# Patient Record
Sex: Female | Born: 1985 | Race: White | Hispanic: No | Marital: Married | Smoking: Never smoker
Health system: Southern US, Community
[De-identification: ages and names within clinical notes are randomized; demographics above are authoritative.]

## PROBLEM LIST (undated history)

## (undated) DIAGNOSIS — N3010 Interstitial cystitis (chronic) without hematuria: Secondary | ICD-10-CM

## (undated) DIAGNOSIS — M069 Rheumatoid arthritis, unspecified: Secondary | ICD-10-CM

## (undated) DIAGNOSIS — B009 Herpesviral infection, unspecified: Secondary | ICD-10-CM

## (undated) DIAGNOSIS — F419 Anxiety disorder, unspecified: Secondary | ICD-10-CM

## (undated) HISTORY — DX: Herpesviral infection, unspecified: B00.9

## (undated) HISTORY — PX: OTHER SURGICAL HISTORY: SHX169

## (undated) HISTORY — PX: CYSTOSCOPY: SUR368

---
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
## 2007-01-20 ENCOUNTER — Ambulatory Visit: Payer: Self-pay | Admitting: Family Medicine

## 2007-01-29 ENCOUNTER — Inpatient Hospital Stay (HOSPITAL_COMMUNITY): Admission: AD | Admit: 2007-01-29 | Discharge: 2007-01-29 | Payer: Self-pay | Admitting: Obstetrics & Gynecology

## 2007-02-18 ENCOUNTER — Ambulatory Visit: Payer: Self-pay | Admitting: Obstetrics & Gynecology

## 2007-02-25 ENCOUNTER — Ambulatory Visit (HOSPITAL_COMMUNITY): Admission: RE | Admit: 2007-02-25 | Discharge: 2007-02-25 | Payer: Self-pay | Admitting: Obstetrics & Gynecology

## 2007-03-11 ENCOUNTER — Ambulatory Visit: Payer: Self-pay | Admitting: Obstetrics & Gynecology

## 2007-03-18 ENCOUNTER — Ambulatory Visit: Payer: Self-pay | Admitting: Obstetrics & Gynecology

## 2007-04-09 ENCOUNTER — Ambulatory Visit: Payer: Self-pay | Admitting: Obstetrics & Gynecology

## 2007-04-20 ENCOUNTER — Ambulatory Visit: Payer: Self-pay | Admitting: Gynecology

## 2007-04-30 ENCOUNTER — Ambulatory Visit: Payer: Self-pay | Admitting: Obstetrics & Gynecology

## 2007-05-07 ENCOUNTER — Ambulatory Visit: Payer: Self-pay | Admitting: Obstetrics & Gynecology

## 2007-05-18 ENCOUNTER — Ambulatory Visit: Payer: Self-pay | Admitting: Gynecology

## 2007-05-25 ENCOUNTER — Ambulatory Visit: Payer: Self-pay | Admitting: Gynecology

## 2007-05-25 ENCOUNTER — Inpatient Hospital Stay (HOSPITAL_COMMUNITY): Admission: AD | Admit: 2007-05-25 | Discharge: 2007-05-26 | Payer: Self-pay | Admitting: Obstetrics & Gynecology

## 2007-05-28 ENCOUNTER — Ambulatory Visit: Payer: Self-pay | Admitting: Obstetrics & Gynecology

## 2007-05-29 ENCOUNTER — Ambulatory Visit (HOSPITAL_COMMUNITY): Admission: RE | Admit: 2007-05-29 | Discharge: 2007-05-29 | Payer: Self-pay | Admitting: Obstetrics & Gynecology

## 2007-06-02 ENCOUNTER — Inpatient Hospital Stay (HOSPITAL_COMMUNITY): Admission: RE | Admit: 2007-06-02 | Discharge: 2007-06-04 | Payer: Self-pay | Admitting: Gynecology

## 2007-06-02 ENCOUNTER — Ambulatory Visit: Payer: Self-pay | Admitting: Gynecology

## 2007-06-09 ENCOUNTER — Ambulatory Visit: Payer: Self-pay | Admitting: Obstetrics & Gynecology

## 2007-07-15 ENCOUNTER — Ambulatory Visit: Payer: Self-pay | Admitting: Obstetrics & Gynecology

## 2007-07-15 ENCOUNTER — Encounter (INDEPENDENT_AMBULATORY_CARE_PROVIDER_SITE_OTHER): Payer: Self-pay | Admitting: Gynecology

## 2008-04-30 ENCOUNTER — Emergency Department (HOSPITAL_COMMUNITY): Admission: EM | Admit: 2008-04-30 | Discharge: 2008-04-30 | Payer: Self-pay | Admitting: Emergency Medicine

## 2008-05-02 ENCOUNTER — Inpatient Hospital Stay (HOSPITAL_COMMUNITY): Admission: AD | Admit: 2008-05-02 | Discharge: 2008-05-02 | Payer: Self-pay | Admitting: Family Medicine

## 2008-05-06 ENCOUNTER — Inpatient Hospital Stay (HOSPITAL_COMMUNITY): Admission: AD | Admit: 2008-05-06 | Discharge: 2008-05-06 | Payer: Self-pay | Admitting: Family Medicine

## 2008-05-06 ENCOUNTER — Encounter: Payer: Self-pay | Admitting: Family Medicine

## 2008-06-08 ENCOUNTER — Encounter: Payer: Self-pay | Admitting: Obstetrics and Gynecology

## 2008-06-08 ENCOUNTER — Encounter: Payer: Self-pay | Admitting: Family Medicine

## 2008-06-08 ENCOUNTER — Ambulatory Visit: Payer: Self-pay | Admitting: Obstetrics and Gynecology

## 2008-06-08 ENCOUNTER — Other Ambulatory Visit: Admission: RE | Admit: 2008-06-08 | Discharge: 2008-06-08 | Payer: Self-pay | Admitting: Obstetrics and Gynecology

## 2008-07-22 ENCOUNTER — Ambulatory Visit (HOSPITAL_BASED_OUTPATIENT_CLINIC_OR_DEPARTMENT_OTHER): Admission: RE | Admit: 2008-07-22 | Discharge: 2008-07-22 | Payer: Self-pay | Admitting: Urology

## 2008-07-22 ENCOUNTER — Encounter (INDEPENDENT_AMBULATORY_CARE_PROVIDER_SITE_OTHER): Payer: Self-pay | Admitting: Urology

## 2009-02-20 ENCOUNTER — Ambulatory Visit: Payer: Self-pay | Admitting: Obstetrics & Gynecology

## 2009-02-20 ENCOUNTER — Encounter: Payer: Self-pay | Admitting: Family Medicine

## 2009-02-20 LAB — CONVERTED CEMR LAB
Basophils Relative: 0 % (ref 0–1)
HCT: 42.4 % (ref 36.0–46.0)
Lymphocytes Relative: 29 % (ref 12–46)
MCHC: 32.3 g/dL (ref 30.0–36.0)
MCV: 99.3 fL (ref 78.0–100.0)
Monocytes Relative: 8 % (ref 3–12)
Neutrophils Relative %: 61 % (ref 43–77)
RBC: 4.27 M/uL (ref 3.87–5.11)
RDW: 14 % (ref 11.5–15.5)
Rh Type: NEGATIVE

## 2009-02-22 ENCOUNTER — Ambulatory Visit (HOSPITAL_COMMUNITY): Admission: RE | Admit: 2009-02-22 | Discharge: 2009-02-22 | Payer: Self-pay | Admitting: Obstetrics & Gynecology

## 2009-03-01 ENCOUNTER — Encounter: Payer: Self-pay | Admitting: Family Medicine

## 2009-03-01 ENCOUNTER — Ambulatory Visit: Payer: Self-pay | Admitting: Family Medicine

## 2009-03-01 ENCOUNTER — Other Ambulatory Visit: Admission: RE | Admit: 2009-03-01 | Discharge: 2009-03-01 | Payer: Self-pay | Admitting: Family Medicine

## 2009-03-15 ENCOUNTER — Ambulatory Visit (HOSPITAL_COMMUNITY): Admission: RE | Admit: 2009-03-15 | Discharge: 2009-03-15 | Payer: Self-pay | Admitting: Obstetrics & Gynecology

## 2009-03-27 ENCOUNTER — Ambulatory Visit (HOSPITAL_COMMUNITY): Admission: RE | Admit: 2009-03-27 | Discharge: 2009-03-27 | Payer: Self-pay | Admitting: Obstetrics & Gynecology

## 2009-03-29 ENCOUNTER — Ambulatory Visit: Payer: Self-pay | Admitting: Obstetrics & Gynecology

## 2009-04-26 ENCOUNTER — Ambulatory Visit (HOSPITAL_COMMUNITY): Admission: RE | Admit: 2009-04-26 | Discharge: 2009-04-26 | Payer: Self-pay | Admitting: Obstetrics & Gynecology

## 2009-04-27 ENCOUNTER — Ambulatory Visit: Payer: Self-pay | Admitting: Obstetrics and Gynecology

## 2009-04-28 ENCOUNTER — Encounter: Payer: Self-pay | Admitting: Family Medicine

## 2009-05-15 ENCOUNTER — Ambulatory Visit (HOSPITAL_COMMUNITY): Admission: RE | Admit: 2009-05-15 | Discharge: 2009-05-15 | Payer: Self-pay | Admitting: Obstetrics & Gynecology

## 2009-05-22 ENCOUNTER — Ambulatory Visit: Payer: Self-pay | Admitting: Obstetrics and Gynecology

## 2009-05-23 ENCOUNTER — Encounter: Payer: Self-pay | Admitting: Obstetrics and Gynecology

## 2009-06-08 ENCOUNTER — Emergency Department (HOSPITAL_COMMUNITY): Admission: EM | Admit: 2009-06-08 | Discharge: 2009-06-08 | Payer: Self-pay | Admitting: Emergency Medicine

## 2009-06-12 ENCOUNTER — Ambulatory Visit: Payer: Self-pay | Admitting: Obstetrics and Gynecology

## 2009-06-19 ENCOUNTER — Ambulatory Visit: Payer: Self-pay | Admitting: Obstetrics and Gynecology

## 2009-07-13 ENCOUNTER — Ambulatory Visit: Payer: Self-pay | Admitting: Obstetrics and Gynecology

## 2009-07-13 ENCOUNTER — Encounter: Payer: Self-pay | Admitting: Obstetrics and Gynecology

## 2009-07-13 LAB — CONVERTED CEMR LAB
MCHC: 35.3 g/dL (ref 30.0–36.0)
MCV: 96.3 fL (ref 78.0–100.0)
RBC: 4 M/uL (ref 3.87–5.11)
RDW: 14.1 % (ref 11.5–15.5)
WBC: 10.1 10*3/uL (ref 4.0–10.5)

## 2009-07-21 ENCOUNTER — Encounter: Payer: Self-pay | Admitting: Obstetrics and Gynecology

## 2009-07-21 ENCOUNTER — Ambulatory Visit: Payer: Self-pay | Admitting: Obstetrics & Gynecology

## 2009-07-26 ENCOUNTER — Ambulatory Visit: Payer: Self-pay | Admitting: Family Medicine

## 2009-07-26 ENCOUNTER — Encounter: Payer: Self-pay | Admitting: Obstetrics and Gynecology

## 2009-08-07 ENCOUNTER — Ambulatory Visit: Payer: Self-pay | Admitting: Obstetrics and Gynecology

## 2009-08-24 ENCOUNTER — Ambulatory Visit: Payer: Self-pay | Admitting: Obstetrics & Gynecology

## 2009-09-06 ENCOUNTER — Ambulatory Visit: Payer: Self-pay | Admitting: Obstetrics & Gynecology

## 2009-09-14 ENCOUNTER — Ambulatory Visit: Payer: Self-pay | Admitting: Family Medicine

## 2009-09-15 ENCOUNTER — Encounter: Payer: Self-pay | Admitting: Family Medicine

## 2009-09-20 ENCOUNTER — Ambulatory Visit: Payer: Self-pay | Admitting: Family Medicine

## 2009-09-27 ENCOUNTER — Ambulatory Visit: Payer: Self-pay | Admitting: Obstetrics & Gynecology

## 2009-10-03 ENCOUNTER — Encounter: Payer: Self-pay | Admitting: Family Medicine

## 2009-10-03 ENCOUNTER — Ambulatory Visit: Payer: Self-pay | Admitting: Obstetrics & Gynecology

## 2009-10-03 LAB — CONVERTED CEMR LAB: Yeast Wet Prep HPF POC: NONE SEEN

## 2009-10-05 ENCOUNTER — Ambulatory Visit: Payer: Self-pay | Admitting: Advanced Practice Midwife

## 2009-10-05 ENCOUNTER — Inpatient Hospital Stay (HOSPITAL_COMMUNITY): Admission: AD | Admit: 2009-10-05 | Discharge: 2009-10-06 | Payer: Self-pay | Admitting: Family Medicine

## 2009-10-12 ENCOUNTER — Ambulatory Visit: Payer: Self-pay | Admitting: Family

## 2009-10-12 ENCOUNTER — Inpatient Hospital Stay (HOSPITAL_COMMUNITY): Admission: AD | Admit: 2009-10-12 | Discharge: 2009-10-15 | Payer: Self-pay | Admitting: Obstetrics & Gynecology

## 2009-12-06 ENCOUNTER — Ambulatory Visit: Payer: Self-pay | Admitting: Family Medicine

## 2010-01-10 ENCOUNTER — Emergency Department (HOSPITAL_COMMUNITY): Admission: EM | Admit: 2010-01-10 | Discharge: 2010-01-11 | Payer: Self-pay | Admitting: Emergency Medicine

## 2010-03-07 ENCOUNTER — Ambulatory Visit: Payer: Self-pay | Admitting: Obstetrics & Gynecology

## 2010-03-07 ENCOUNTER — Encounter (INDEPENDENT_AMBULATORY_CARE_PROVIDER_SITE_OTHER): Payer: Self-pay | Admitting: *Deleted

## 2010-03-07 LAB — CONVERTED CEMR LAB
Clue Cells Wet Prep HPF POC: NONE SEEN
Yeast Wet Prep HPF POC: NONE SEEN

## 2010-04-19 ENCOUNTER — Ambulatory Visit: Payer: Self-pay | Admitting: Gastroenterology

## 2010-04-19 DIAGNOSIS — K625 Hemorrhage of anus and rectum: Secondary | ICD-10-CM

## 2010-06-16 IMAGING — US OB
1 series · 14 of 16 positions shown · non-contrast
Comparison: Early OB ultrasound 05/02/2008.

CLINICAL DATA: Vaginal bleeding and cramping.  G2 P1.  LMP
03/08/2008 ([DATE] weeks 3 days).

TRANSVAGINAL OB ULTRASOUND 05/06/2008:
TECHNIQUE: Transvaginal ultrasound was performed for evaluation of
the gestation as well as the maternal uterus and adnexal regions.

[Series 1: ob · 0.18mm/px · 14 of 21 slices shown]
[im 1/21]
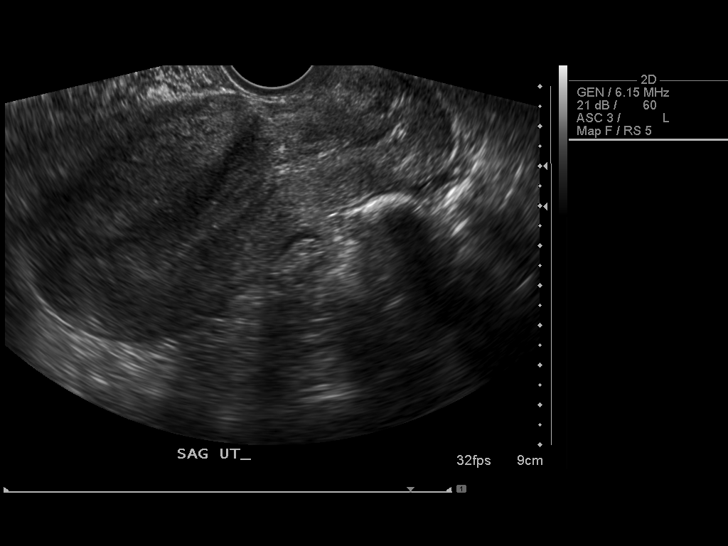
[im 2/21]
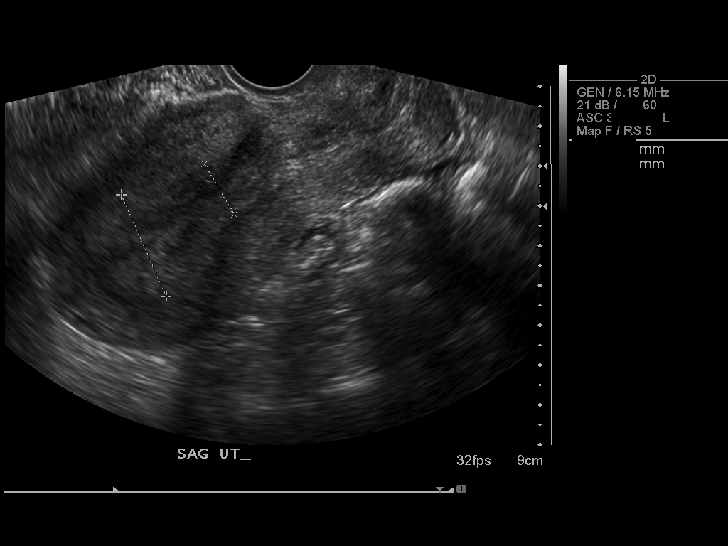
[im 3/21]
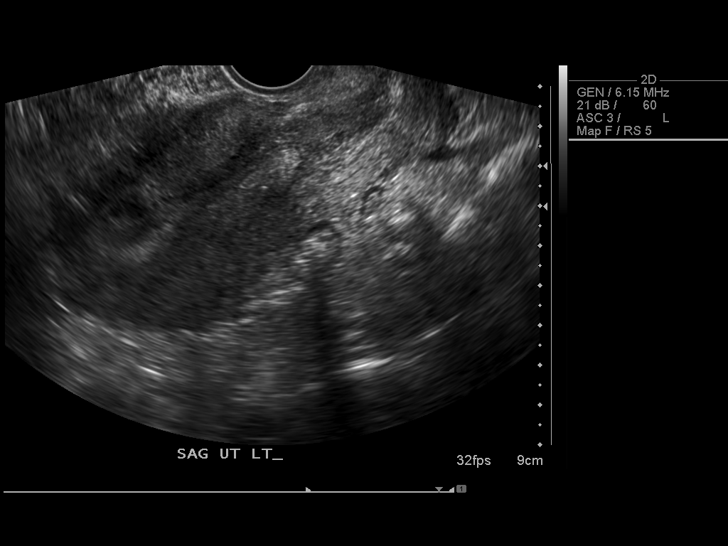
[im 6/21]
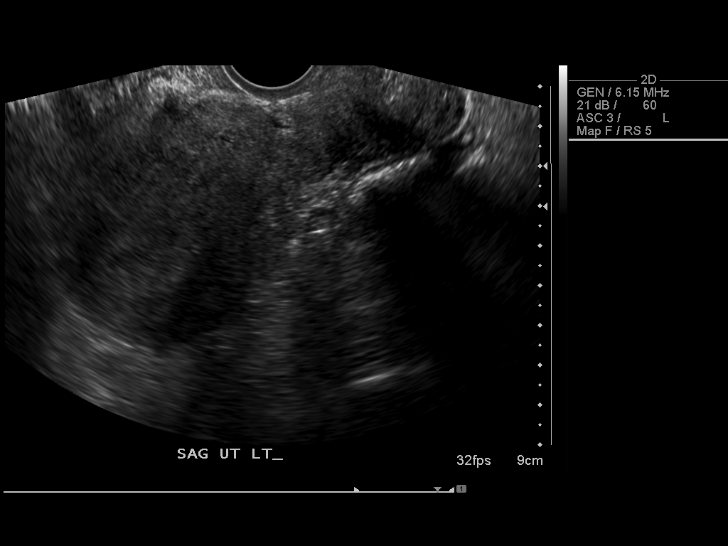
[im 7/21]
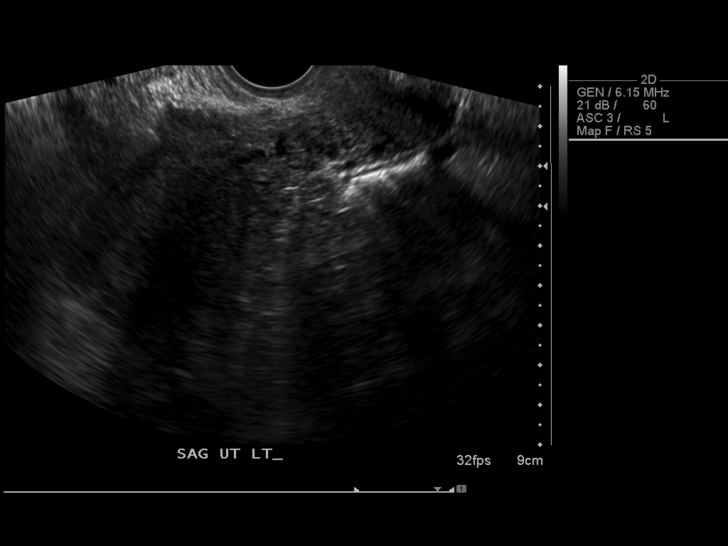
[im 9/21]
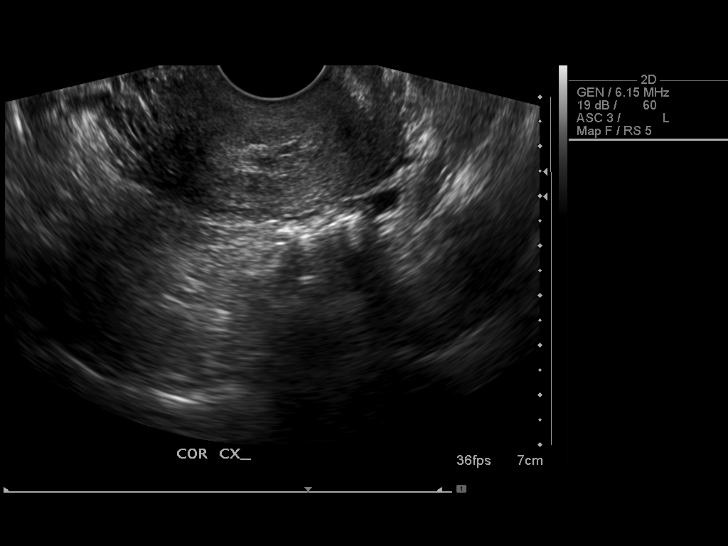
[im 10/21]
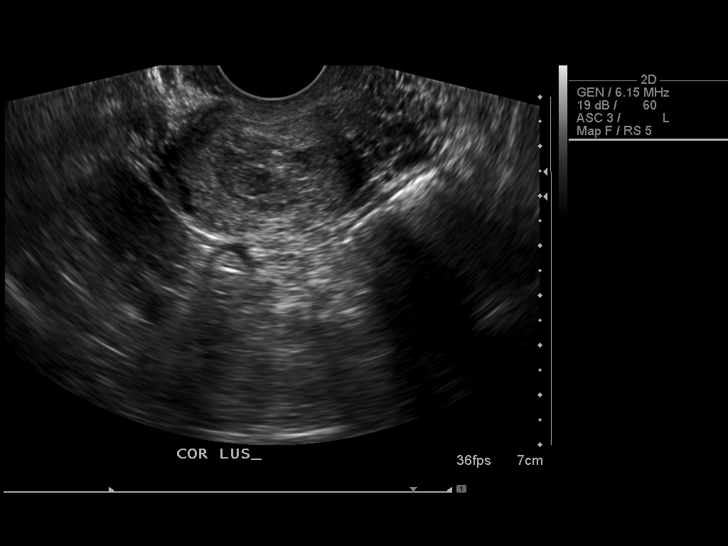
[im 11/21]
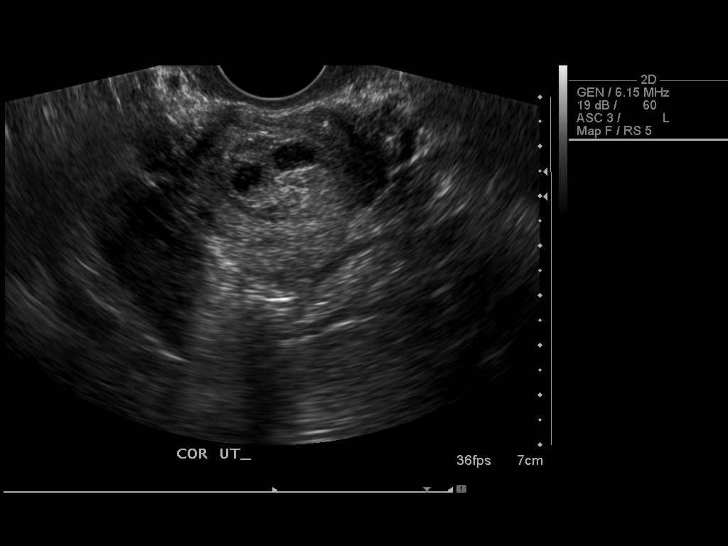
[im 13/21]
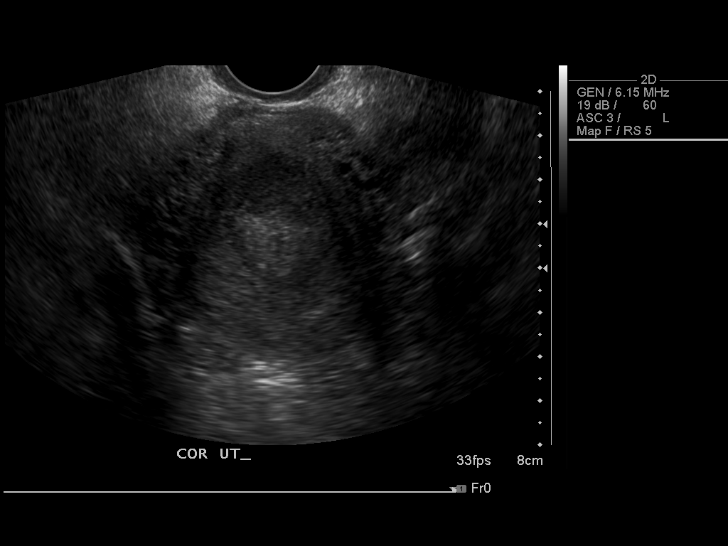
[im 14/21]
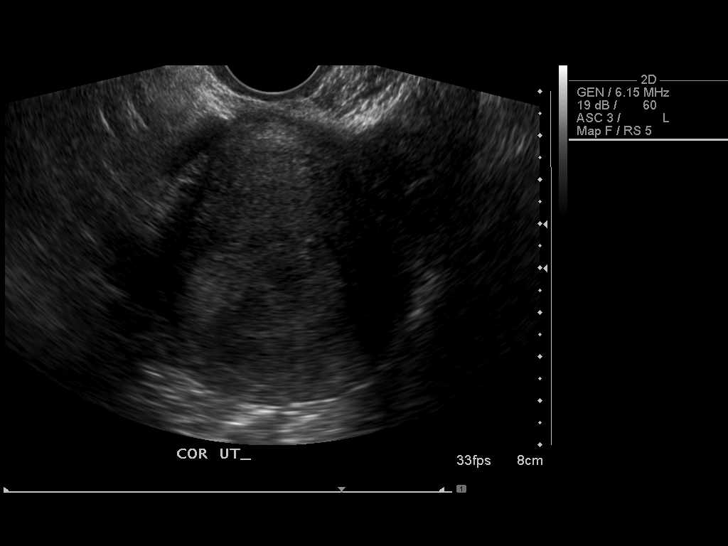
[im 17/21]
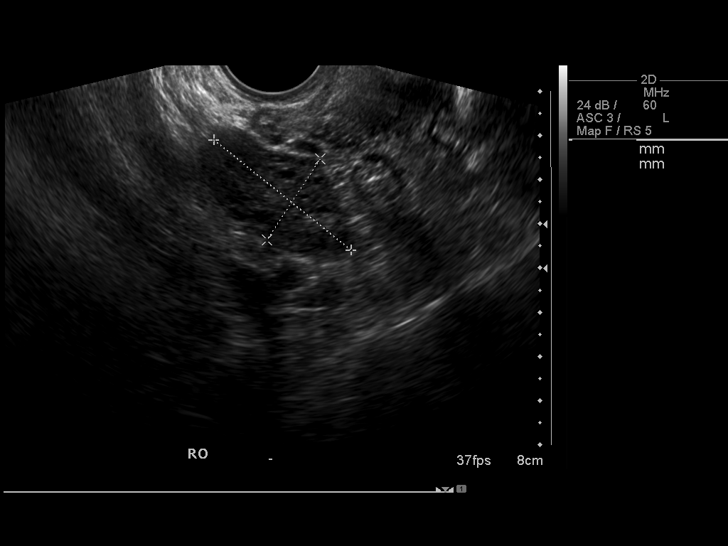
[im 18/21]
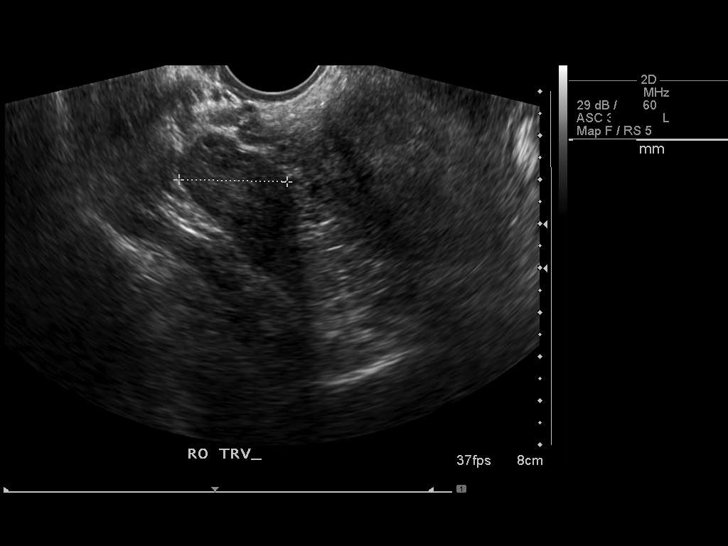
[im 19/21]
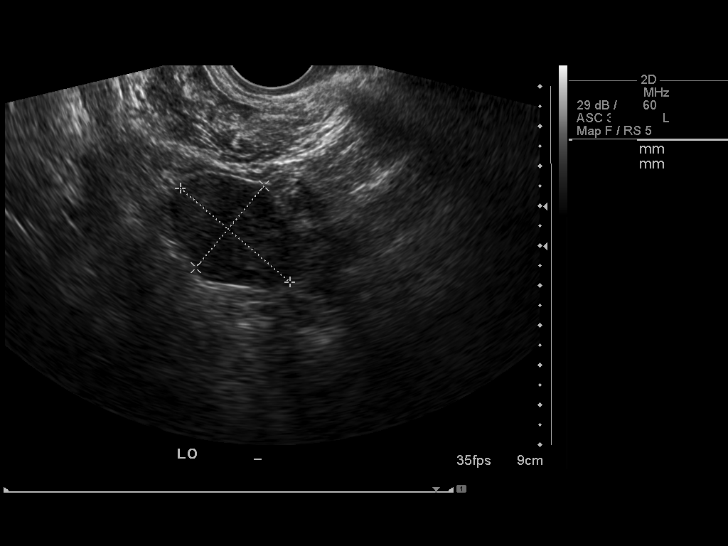
[im 21/21]
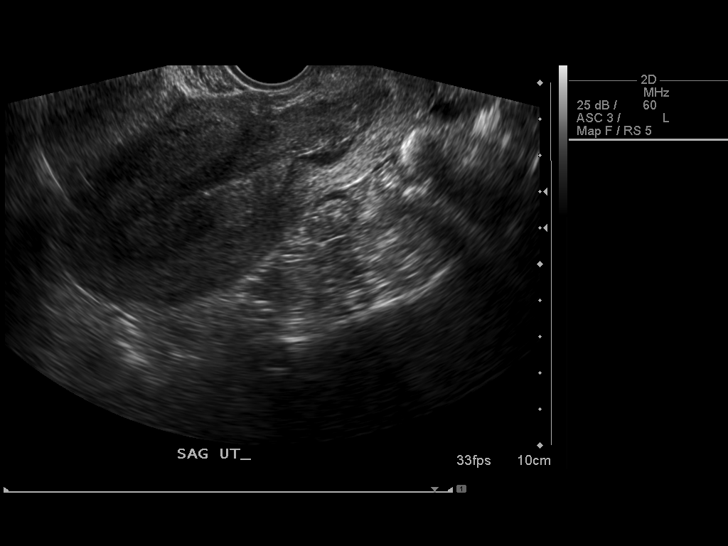

[14 of 16 positions shown; findings below may reference images not displayed]

FINDINGS: Intrauterine gestational sac no longer visible.  Embryo
and yolk sac no longer visible.  No embryonic cardiac activity.
Thickened endometrium up to approximately 28 mm.  Echogenic
material within the endometrial canal consistent with blood and
products of conception.  Nabothian cysts again noted on the cervix.

Both ovaries normal in size and appearance, containing small
follicular cysts.  Right ovary approximates 4.0 x 2.2 x 2.4 cm and
left ovary approximates 3.6 x 2.7 x 3.1 cm.  No adnexal masses or
free fluid.
IMPRESSION: Intrauterine embryonic demise, as the gestational sac, embryo, and
yolk sac are no longer visible.  Heterogeneous material within the
thickened endometrium consistent with blood and/or products of
conception.

## 2010-07-29 ENCOUNTER — Encounter: Payer: Self-pay | Admitting: Obstetrics & Gynecology

## 2010-08-07 NOTE — Assessment & Plan Note (Signed)
Summary: BLOOD IN STOOL...AS.   History of Present Illness Visit Type: Initial Consult Primary GI MD: Melvia Heaps MD [facility] Primary Provider: n/a Requesting Provider: Jaynie Collins, MD Chief Complaint: Intermittant BRB in stool with mucus and rectal bleeding x 2 years. Pt states she will pass blood clots with a BM also. Pt has some intermittant lower abd pain with some constipation after she delivered her son x 3 years ago. History of Present Illness:   Ms. Nicole Burgess a 25 year old white female referred at the request of Dr. Macon Large for evaluation of rectal bleeding.  For the past 2-3 years she has been complaining of limited rectal bleeding consistening of small amounts of bright red blood coating the stool or on the toilet tissue.  She passes  mucus on occasion.  She also complains of lower abdominal crampy pain and frequent rectal pain with a bowel movemen.   She tends to be constipated and has a bowel movement every 3-4 days.   GI Review of Systems    Reports abdominal pain, belching, and  bloating.     Location of  Abdominal pain: lower abdomen.    Denies acid reflux, chest pain, dysphagia with liquids, dysphagia with solids, heartburn, loss of appetite, nausea, vomiting, vomiting blood, weight loss, and  weight gain.      Reports change in bowel habits, constipation, rectal bleeding, and  rectal pain.     Denies anal fissure, black tarry stools, diarrhea, diverticulosis, fecal incontinence, heme positive stool, hemorrhoids, irritable bowel syndrome, jaundice, light color stool, and  liver problems. Preventive Screening-Counseling & Management  Alcohol-Tobacco     Smoking Status: never      Drug Use:  no.      Current Medications (verified): 1)  None  Allergies (verified): No Known Drug Allergies  Past History:  Past Medical History: Interstitial Cystitis Urinary Tract Infection  Past Surgical History: Unremarkable  Family History: Family History of Colon  Cancer:Grandmother  Social History: Married Occupation: Teacher, English as a foreign language at [employer] Patient has never smoked.  Alcohol Use - yes Daily Caffeine Use Illicit Drug Use - no Smoking Status:  never Drug Use:  no  Review of Systems       The patient complains of back pain.  The patient denies allergy/sinus, anemia, anxiety-new, arthritis/joint pain, blood in urine, breast changes/lumps, change in vision, confusion, cough, coughing up blood, depression-new, fainting, fatigue, fever, headaches-new, hearing problems, heart murmur, heart rhythm changes, itching, menstrual pain, muscle pains/cramps, night sweats, nosebleeds, pregnancy symptoms, shortness of breath, skin rash, sleeping problems, sore throat, swelling of feet/legs, swollen lymph glands, thirst - excessive , urination - excessive , urination changes/pain, urine leakage, vision changes, and voice change.         All other systems were reviewed and were negative   Vital Signs:  Patient profile:   25 year old female Height:      64 inches Weight:      195.25 pounds BMI:     33.64 Pulse rate:   88 / minute Pulse rhythm:   regular BP sitting:   122 / 72  (right arm) Cuff size:   regular  Vitals Entered By: Christie Nottingham CMA Duncan Dull) (April 19, 2010 10:53 AM)  Physical Exam  Additional Exam:  On physical exam she is a well-developed well-nourished female  skin: anicter  HEENT: normocephalic; PEERLA; no nasal or orpharyngeal abnormalities neck: supple nodes: no cervical adenopathy chest: clear cor:  no murmurs, gallops or rubs abd:  bowel sounds normoactive; no abdominal  masses, tenderness, organomegaly rectal: no masses; stool heme negative; there no obvious fissures or hemorrhoids ext: no cyanosis, clubbing, or edema skeletal: no gross skeletal abnormalities neuro: alert, oriented x 3; no focal abnormalities    Impression & Recommendations:  Problem # 1:  HEMORRHAGE OF RECTUM AND ANUS (ICD-569.3) Assessment  New Limitedher rectal bleeding is most likely due to perirectal disease including hemorrhoids or an anal fissure.  Bleeding from proctitis, colitis or juvenile polyps is much less likely.  Recommendations #1 stool softener #2 Anusol HC suppositories #3 local rectal care  Patient Instructions: 1)  Copy sent to : Jaynie Collins, MD 2)  Please make a follow up appointment for 3 weeks 3)  The medication list was reviewed and reconciled.  All changed / newly prescribed medications were explained.  A complete medication list was provided to the patient / caregiver. 4)  Warm soaks daily 5)  Stool softeners as needed  6)  Avoid spicy foods  Prescriptions: ANUSOL-HC 25 MG  SUPP (HYDROCORTISONE ACETATE) take one suppository before retiring  #10 x 1   Entered and Authorized by:   Louis Meckel MD   Signed by:   Louis Meckel MD on 04/19/2010   Method used:   Electronically to        [facility] [phone]* (retail)       [address], [place]  [address]       Ph: [phone]       Fax: [phone]   RxID:   [id number]

## 2010-08-07 NOTE — Letter (Signed)
Summary: Results Letter  [facility]  [address] [place], [place] [address]   Phone: [phone]  Fax: [phone]        April 19, 2010 MRN: [id number]    [facility] [address] [place], [place]  [address]    Dear Ms. YOUNG,  It is my pleasure to have treated you recently as a new patient in my office. I appreciate your confidence and the opportunity to participate in your care.  Since I do have a busy inpatient endoscopy schedule and office schedule, my office hours vary weekly. I am, however, available for emergency calls everyday through my office. If I am not available for an urgent office appointment, another one of our gastroenterologist will be able to assist you.  My well-trained staff are prepared to help you at all times. For emergencies after office hours, a physician from our Gastroenterology section is always available through my 24 hour answering service  Once again I welcome you as a new patient and I look forward to a happy and healthy relationship             Sincerely,  Louis Meckel MD  This letter has been electronically signed by your physician.  Appended Document: Results Letter letter mailed

## 2010-08-07 NOTE — Letter (Signed)
Summary: New Patient letter  [facility]  [address] [place], [place] [address]   Phone: [phone]  Fax: [phone]       03/07/2010 MRN: [id number]  [facility] YOUNG [address] [place], [place]  [address]  Dear Ms. YOUNG,  Welcome to the Gastroenterology Division at [facility].    You are scheduled to see Dr. Arlyce Dice on 04/19/2010 at 10:45AM on the 3rd floor at [facility], [address]  We ask that you try to arrive at our office 15 minutes prior to your appointment time to allow for check-in.  We would like you to complete the enclosed self-administered evaluation form prior to your visit and bring it with you on the day of your appointment.  We will review it with you.  Also, please bring a complete list of all your medications or, if you prefer, bring the medication bottles and we will list them.  Please bring your insurance card so that we may make a copy of it.  If your insurance requires a referral to see a specialist, please bring your referral form from your primary care physician.  Co-payments are due at the time of your visit and may be paid by cash, check or credit card.     Your office visit will consist of a consult with your physician (includes a physical exam), any laboratory testing he/she may order, scheduling of any necessary diagnostic testing (e.g. x-ray, ultrasound, CT-scan), and scheduling of a procedure (e.g. Endoscopy, Colonoscopy) if required.  Please allow enough time on your schedule to allow for any/all of these possibilities.    If you cannot keep your appointment, please call [phone] to cancel or reschedule prior to your appointment date.  This allows [place] the opportunity to schedule an appointment for another patient in need of care.  If you do not cancel or reschedule by 5 p.m. the business day prior to your appointment date, you will be charged a $50.00 late cancellation/no-show fee.    Thank you for choosing  [facility] for your medical needs.  We appreciate the opportunity to care for you.  Please visit [place] at our website  to learn more about our practice.                     Sincerely,                                                             The Gastroenterology Division

## 2010-09-26 LAB — URINALYSIS, ROUTINE W REFLEX MICROSCOPIC
Bilirubin Urine: NEGATIVE
Glucose, UA: NEGATIVE mg/dL
Ketones, ur: NEGATIVE mg/dL
pH: 6 (ref 5.0–8.0)

## 2010-09-26 LAB — RH IMMUNE GLOB WKUP(>/=20WKS)(NOT WOMEN'S HOSP): Fetal Screen: NEGATIVE

## 2010-09-26 LAB — CBC
HCT: 41.1 % (ref 36.0–46.0)
MCHC: 34.1 g/dL (ref 30.0–36.0)
Platelets: 114 10*3/uL — ABNORMAL LOW (ref 150–400)
Platelets: 143 10*3/uL — ABNORMAL LOW (ref 150–400)
RDW: 14.6 % (ref 11.5–15.5)
RDW: 15 % (ref 11.5–15.5)

## 2010-09-26 LAB — RPR: RPR Ser Ql: NONREACTIVE

## 2010-10-22 LAB — POCT PREGNANCY, URINE: Preg Test, Ur: NEGATIVE

## 2010-10-22 LAB — POCT HEMOGLOBIN-HEMACUE: Hemoglobin: 14.7 g/dL (ref 12.0–15.0)

## 2010-11-20 NOTE — Discharge Summary (Signed)
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   DISCHARGE DIAGNOSIS:  Postoperative day #2 status post primary low  transverse cesarean section, indicated for active HSV-2 infection.  On  the date of discharge, the patient is postoperative greater than 48  hours.   SIGNIFICANT FINDINGS:  Prenatal laboratory work-up showed that the  patient's blood type was A negative.  Antibody was negative.  RPR was  nonreactive.  Rubella immune.  Hepatitis B surface antigen negative.  HIV nonreactive.  Of note, the patient had an active HSV infection at  the time of delivery.  Therefore, the patient was taken for primary low  transverse cesarean section because of this.   On admission, the patient's CBC showed a hemoglobin of 14.1, platelet  count of 196 and white blood cell count of 13.6.  On postoperative day  #1, the patient had a white blood cell count of 10.5, hemoglobin of 11.5  and platelet count of 149.   HOSPITAL COURSE:  The patient is a 25 year old now G1, P1-0-0-1 who was  admitted with a term pregnancy and an active HSV-2 infection, and  therefore was scheduled for and taken to the OR for a low transverse  cesarean section, given her active HSV infection.  On June 02, 2007,  the patient was taken back for the procedure performed by Dr. Blima Rich.  He was assisted by Dr. Nicholaus Bloom.  Estimated blood loss was  600 cc during the procedure.  A term female infant was delivered in the  vertex presentation.  Apgars and weight are per delivery room  record.  No gross abnormalities were noted.  In the delivery room, the baby cried  spontaneously.  There was a nuchal cord x1, and amniotic fluids was  clear and did not smell foul.  Placenta was complete with a 3-vessel  cord and central cord insertion.  The patient tolerated the procedure  well, and after the procedure was returned to the recovery room in  excellent condition.   Postoperatively, the patient had an uncomplicated course.  Throughout  the remainder of her hospital stay, her vital signs remained stable, and  she was afebrile throughout her postoperative course.  On post day #2,  the patient was doing well, had decreased pain, decreased bleeding.  Her  incision site remained clean, dry, and intact.  She was ambulating  without difficulty, tolerating food by mouth, and had had positive urine  and positive flatus.  On postoperative day #2, given the fact that it  was Thanksgiving Day, the patient very much desired to return home.  Given that the patient was greater than 48  hours after her surgery and  doing well, it was deemed that the patient was fit for discharge.  The  patient was advised for any increased pain, increased bleeding,  presyncope, syncope, dizziness, lightheadedness rule out other  concerning symptoms.  The patient was discharged with instructions to  return to her prenatal care clinic for staple removal on Monday,  June 08, 2007.  The patient expressed understanding and agreement  with these instructions.  On postoperative day #2, the patient was  deemed fit for discharge and discharged to home in good and stable  condition.   DISCHARGE MEDICATIONS:  1. The patient is discharged on Valtrex per her outpatient regimen.  2. The patient is discharged home on Percocet 5/325 mg p.o. 1-2 every      6 hours p.r.n. pain.  3. Ibuprofen 600 mg p.o. q.6h. p.r.n. pain.  4. Colace 100 mg p.o. b.i.d. p.r.n. constipation.  5. Micronor one tablet daily per package  instructions.   DISCHARGE INSTRUCTIONS:  1. The patient is to take medications as mentioned previously.  2. The patient is to seek medical attention for any increased      bleeding, increased pain, presyncope, syncope, dizziness,      lightheaded or other concerning symptoms.  3. The patient is to maintain pelvic rest for 6 weeks with nothing in      the vagina over this time.  4. The patient is to not undertake any heavy lifting for the next 6      weeks.  5. The patient is to return to her prenatal care clinic on Monday,      June 08, 2007, for staple removal.  This will be postoperative      day #6, I believe.   DISCHARGE CONDITION:  Stable, good.   DISPOSITION:  The patient is discharged to home with instructions to  return to the prenatal care clinic on postoperative day #6 for staple  removal.  At the time of discharge, the patient is ambulating well,  vital signs are stable, and she is ready for discharge to home.      Myrtie Soman, MD      Ginger Carne, MD  Electronically Signed    TE/MEDQ  D:  06/04/2007  T:  06/04/2007  Job:  [id number]

## 2010-11-20 NOTE — Assessment & Plan Note (Signed)
Nicole Burgess, Nicole Burgess                 ACCOUNT NO.:  [id number]   MEDICAL RECORD NO.:  [id number]          PATIENT TYPE:  POB   LOCATION:  [facility] at [facility]         FACILITY:  [facility]   PHYSICIAN:  Argentina Donovan, MD        DATE OF BIRTH:  August 14, 1985   DATE OF SERVICE:                                  CLINIC NOTE   The patient is a 25 year old gravida 2, para 1-0-1-1 with a spontaneous  complete abortion in the MAU on May 06, 2008, with no complications  or problems.  She comes in today for followup at which time she tells [place]  that over the past couple of years, she has had chronic problems with  pain prior to and with urination, although most of it went away after  she urinated.  Her urinary cultures she said have always shown  infection, although we do not have any record of that since 2008 during  her previous pregnancy treated for a urinary tract infection.  In  addition to that, she requests birth control using the pills.  She has  used in the past and had been quite satisfied with them.   PHYSICAL EXAMINATION:  Her abdomen was soft and nontender.  No masses,  no organomegaly.  External genitalia is normal.  BUS within normal  limits.  Vagina is clean and well rugated.  The cervix is clean and  parous.  Uterus is anterior with normal size, shape, and consistency.  The adnexa is normal.   IMPRESSION:  Normal pelvic examination.  Pap smear was taken.  Urine  culture and urinalysis will be obtained.  We are going to refer the  patient to he urologist as I think she is probably going to need a  cystoscopy to explain her urinary symptoms, she only however has  occasional nocturia.  We have prescribed Loestrin 24 FE for the patient,  explained in detail how to take it, how to start, and the impression is  post spontaneous abortion examination normal.           ______________________________  Argentina Donovan, MD     PR/MEDQ  D:  06/08/2008  T:  06/08/2008  Job:  [id number]

## 2010-11-20 NOTE — Assessment & Plan Note (Signed)
Nicole Burgess, Nicole Burgess                 ACCOUNT NO.:  [id number]   MEDICAL RECORD NO.:  [id number]          PATIENT TYPE:  POB   LOCATION:  [facility] at [facility]         FACILITY:  [facility]   PHYSICIAN:  Tinnie Gens, MD        DATE OF BIRTH:  1986-05-26   DATE OF SERVICE:  12/06/2009                                  CLINIC NOTE   CHIEF COMPLAINT:  Postpartum check.   HISTORY OF PRESENT ILLNESS:  The patient is a 25 year old gravida 2,  para 2 who comes in today for postpartum check.  She is 7 weeks status  post VBAC.  She has stopped breastfeeding.  She reports her mood is  good.  The baby is sleeping through the night.  She is complaining of  painful bowel movements and bright red blood with bowel movements, blood  in the toilet and blood when she wipes.  She also had some mucusy  vaginal discharge that has seemed to have resolved.   On exam today, vitals are as noted in the chart.  She is a well-  developed, well-nourished female in no acute distress.  Her abdomen is  soft, nontender, and nondistended.  GU, normal external female  genitalia.  BUS is normal.  Vagina is pink and rugated.  Uterus is  small, anteverted.  Cervix is closed.  No adnexal mass or tenderness.  There are no external hemorrhoids visible.  The patient is not really  constipated.   IMPRESSION:  1. Postpartum check.  2. Blood in her stools.   PLAN:  1. If bloody stools continue, we will need to referral to GI for      further workup.  2. Needs followup Pap smear 8/10.  3. Given information about Implanon and IUD.  Her husband is in the      service now and should not actively having sex, but would like to      start on the pills.  Samples were given and she can call back in      for refills as needed.           ______________________________  Tinnie Gens, MD     TP/MEDQ  D:  12/06/2009  T:  12/07/2009  Job:  [id number]

## 2010-11-20 NOTE — Assessment & Plan Note (Signed)
Nicole Burgess, SARSFIELD                 ACCOUNT NO.:  [id number]   MEDICAL RECORD NO.:  [id number]          PATIENT TYPE:  POB   LOCATION:  [facility] at [facility]         FACILITY:  [facility]   PHYSICIAN:  Jaynie Collins, MD     DATE OF BIRTH:  [birth year]   DATE OF SERVICE:  03/07/2010                                  CLINIC NOTE   REASON FOR VISIT:  Vaginal discharge.   HISTORY:  The patient is a 24-year gravida 2, para 2 who comes in today  for evaluation of increased mucousy vaginal discharge.  She said this  has been going on since she had a vaginal delivery in April.  She denies  any pruritus, erythema, or any other concerns.  The patient has stopped  breast feeding.  She is using Loestrin oral contraceptive pills.  She is  interested in changing to NuvaRing today.  The patient does also report  that she is have increased blood in stool and also mucus in stool.  She  denies having any hemorrhoids or any other problems and wants to see if  this could be further evaluated.  No other gynecologic concerns.   PHYSICAL EXAMINATION:  VITAL SIGNS:  Blood pressure is 106/75, pulse is  74, blood pressure 198, height 5 feet 5 inches.  GENERAL:  No apparent distress.  ABDOMEN:  Soft, nontender, nondistended.  PELVIC:  Normal external female genitalia.  Pink, well-rugated vagina.  Small amount of mucousy discharge noted in the posterior fornix.  A  sample was taken for wet prep.  Uterus is small, anteverted, and  nontender on examination.  No cervical motion tenderness or adnexal  masses or tenderness.  NuvaRing was placed at this point.  RECTAL:  No external hemorrhoids or any other sources of bleeding noted.   IMPRESSION AND PLAN:  The patient is a 24-year gravida 2, para 2 here  for vaginal discharge and blood in stool.  She also wanted to start  NuvaRing.  On vaginal examination, small amount of discharge was seen  and sent for wet prep.  We will follow up on the results and NuvaRing  was also  placed during the pelvic examination.  The patient will be  given prescription to continue on this modality.  As for blood in her  stool, no visible sources of bleeding were noted.  The patient will be  referred to Gastroenterology for further evaluation of this blood in  stool given that it has persisted for quite some time.  She first  mentioned it at her postpartum visit in June.  We will follow up on the  Gastroenterology findings and recommendations.  The patient is also due  for an annual examination.  She will call to schedule this at her  earliest convenience.           ______________________________  Jaynie Collins, MD     UA/MEDQ  D:  03/07/2010  T:  03/08/2010  Job:  [id number]

## 2010-11-20 NOTE — Op Note (Signed)
NAMELOANY, NEUROTH                 ACCOUNT NO.:  [id number]   MEDICAL RECORD NO.:  [id number]          PATIENT TYPE:  AMB   LOCATION:  [facility]                         FACILITY:  [facility]   PHYSICIAN:  Sigmund I. Patsi Sears, M.D.DATE OF BIRTH:  [birth year]   DATE OF PROCEDURE:  07/22/2008  DATE OF DISCHARGE:  07/22/2008                               OPERATIVE REPORT   DIAGNOSIS:  Interstitial cystitis.   POSTOPERATIVE DIAGNOSIS:  Interstitial cystitis.   OPERATION:  Cystourethroscopy, hydrodistention of bladder, cold cup  bladder biopsy with cauterization of biopsy sites, installation of  Pyridium and Marcaine in the bladder, injection of Marcaine Kenalog in  the subtrigonal space.   SURGEON:  Sigmund I. Patsi Sears, M.D.   ANESTHESIA:  General LMA.   PREPARATION:  After appropriate preanesthesia, the patient is brought to  the operating room, placed on the operating table in dorsal supine  position where general LMA anesthesia was introduced.  She was then  replaced in dorsal lithotomy position where the pubis was prepped with  Betadine solution and draped in usual fashion.   PROCEDURE:  Cystourethroscopy accomplished, bladder hydrodistended.  Cold cup bladder biopsies were taken, and biopsy sites were cauterized.  Marcaine Pyridium solution was inserted bladder, and Marcaine Kenalog  solution was injected in the subtrigonal space.  She was given a B and O  suppository, and IV Toradol.  She was awakened and recovery room in good  condition.      Sigmund I. Patsi Sears, M.D.  Electronically Signed     SIT/MEDQ  D:  07/29/2008  T:  07/29/2008  Job:  [id number]

## 2010-11-20 NOTE — Op Note (Signed)
Nicole Burgess, Nicole Burgess                 ACCOUNT NO.:  [id number]   MEDICAL RECORD NO.:  [id number]          PATIENT TYPE:  INP   LOCATION:                                FACILITY:  [facility]   PHYSICIAN:  Ginger Carne, MD  DATE OF BIRTH:  [birth year]   DATE OF PROCEDURE:  DATE OF DISCHARGE:                               OPERATIVE REPORT   PREOPERATIVE DIAGNOSES:  1. Active vulvar herpes simplex virus.  2. Term pregnancy.   POSTOPERATIVE DIAGNOSES:  1. Active vulvar herpes simplex virus.  2. Term pregnancy.  3. Term viable delivery of female infant.   PROCEDURE:  Primary low transverse cesarean section.   PROCEDURE:  Primary low transverse cesarean section.   SURGEON:  Blima Rich, M.D.   ASSISTANT:  Nicholaus Bloom, M.D.   ESTIMATED BLOOD LOSS:  600 mL.   COMPLICATIONS:  None immediate.   ANESTHESIA:  Spinal.   SPECIMEN:  Cord bloods.   OPERATIVE FINDINGS:  Term infant female delivered in a vertex  presentation.  Apgar and weight per delivery room record.  No gross  abnormalities.  Baby cried spontaneously at delivery.  Nuchal cord x1.  Amniotic fluid clear, non foul-smelling.  Placenta complete, three-  vessel cord central insertion.   OPERATIVE PROCEDURE:  The patient prepped and draped in usual fashion  and placed in left lateral supine position.  Betadine solution used for  antiseptic, and the patient was catheterized prior to procedure.  After  adequate spinal analgesia, a Pfannenstiel incision was made and the  abdomen opened.  Lower uterine segment incised transversely after  developing the bladder flap.  Baby delivered, cord clamped and cut, and  infant given to the pediatric staff after bulb suctioning.  Suction cup  was utilized to facilitate delivery.  Closure of the uterus in 1 layer  of Vicryl running interlocking suture.  Bleeding points hemostatically  checked and blood clots removed.  Closure of  the fascia in 1 layer with 0 double loop PDS running suture, 0  Vicryl  for the subcutaneous layer and skin staples for the skin.  Instrument  and sponge count were correct.  The patient tolerated the procedure well  and returned to post-anesthesia recovery room in excellent condition.      Ginger Carne, MD  Electronically Signed     SHB/MEDQ  D:  06/02/2007  T:  06/02/2007  Job:  [id number]

## 2011-01-07 ENCOUNTER — Emergency Department (HOSPITAL_COMMUNITY)
Admission: EM | Admit: 2011-01-07 | Discharge: 2011-01-07 | Disposition: A | Discharge status: Home / Self Care | Payer: Medicaid Other | Attending: Emergency Medicine | Admitting: Emergency Medicine

## 2011-01-07 ENCOUNTER — Emergency Department (HOSPITAL_COMMUNITY): Payer: Medicaid Other

## 2011-01-07 DIAGNOSIS — O2390 Unspecified genitourinary tract infection in pregnancy, unspecified trimester: Secondary | ICD-10-CM | POA: Insufficient documentation

## 2011-01-07 DIAGNOSIS — N390 Urinary tract infection, site not specified: Secondary | ICD-10-CM | POA: Insufficient documentation

## 2011-01-07 DIAGNOSIS — O200 Threatened abortion: Secondary | ICD-10-CM | POA: Insufficient documentation

## 2011-01-07 LAB — URINALYSIS, ROUTINE W REFLEX MICROSCOPIC
Bilirubin Urine: NEGATIVE
Nitrite: POSITIVE — AB
Specific Gravity, Urine: <1.005 — ABNORMAL LOW (ref 1.005–1.030)
Urobilinogen, UA: 0.2 mg/dL (ref 0.0–1.0)

## 2011-01-07 LAB — DIFFERENTIAL
Basophils Relative: 0 % (ref 0–1)
Eosinophils Absolute: 0.1 10*3/uL (ref 0.0–0.7)
Neutrophils Relative %: 67 % (ref 43–77)

## 2011-01-07 LAB — CBC
MCH: 32.9 pg (ref 26.0–34.0)
Platelets: 185 10*3/uL (ref 150–400)
RBC: 4.23 MIL/uL (ref 3.87–5.11)
WBC: 9.1 10*3/uL (ref 4.0–10.5)

## 2011-01-07 LAB — HCG, QUANTITATIVE, PREGNANCY: hCG, Beta Chain, Quant, S: 32598 m[IU]/mL — ABNORMAL HIGH (ref <5)

## 2011-01-07 LAB — WET PREP, GENITAL: Clue Cells Wet Prep HPF POC: NONE SEEN

## 2011-01-07 LAB — URINE MICROSCOPIC-ADD ON

## 2011-01-08 LAB — RH IMMUNE GLOBULIN WORKUP (NOT WOMEN'S HOSP)
ABO/RH(D): A NEG
Antibody Screen: NEGATIVE

## 2011-01-09 LAB — GC/CHLAMYDIA PROBE AMP, GENITAL: Chlamydia, DNA Probe: NEGATIVE

## 2011-02-26 ENCOUNTER — Other Ambulatory Visit: Payer: Self-pay | Admitting: Obstetrics & Gynecology

## 2011-02-26 ENCOUNTER — Other Ambulatory Visit (HOSPITAL_COMMUNITY)
Admission: RE | Admit: 2011-02-26 | Discharge: 2011-02-26 | Disposition: A | Discharge status: Home / Self Care | Payer: Medicaid Other | Source: Ambulatory Visit | Attending: Obstetrics & Gynecology | Admitting: Obstetrics & Gynecology

## 2011-02-26 DIAGNOSIS — Z113 Encounter for screening for infections with a predominantly sexual mode of transmission: Secondary | ICD-10-CM | POA: Insufficient documentation

## 2011-02-26 DIAGNOSIS — Z01419 Encounter for gynecological examination (general) (routine) without abnormal findings: Secondary | ICD-10-CM | POA: Insufficient documentation

## 2011-04-04 IMAGING — US US OB COMP LESS 14 WKS
1 series · 14 of 22 positions shown · non-contrast
Comparison: none

OBSTETRICAL ULTRASOUND:
 This ultrasound exam was performed in the [HOSPITAL] Ultrasound Department.  The OB US report was generated in the AS system, and faxed to the ordering physician.  This report is also available in [REDACTED] PACS.

[Series 1: us ob comp less 14 wks · 0.24mm/px · 14 of 22 slices shown]
[im 1/22]
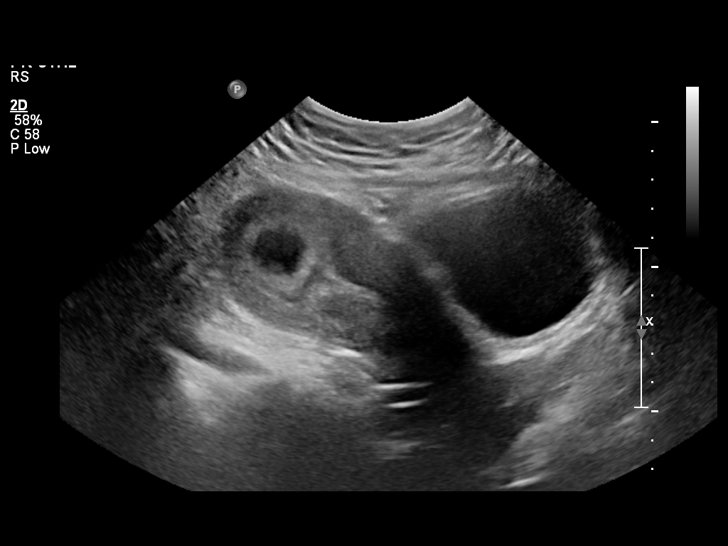
[im 3/22]
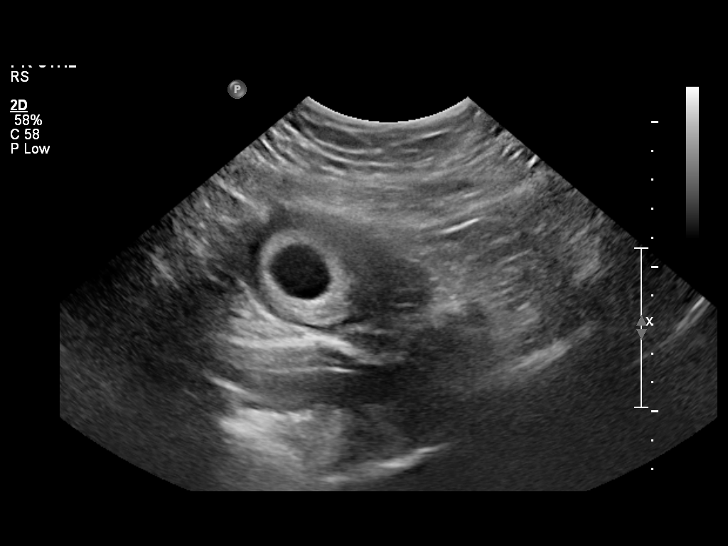
[im 4/22]
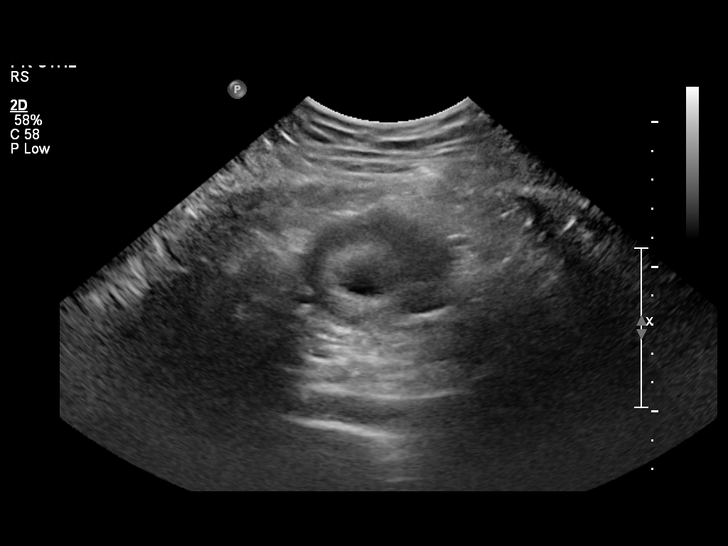
[im 6/22]
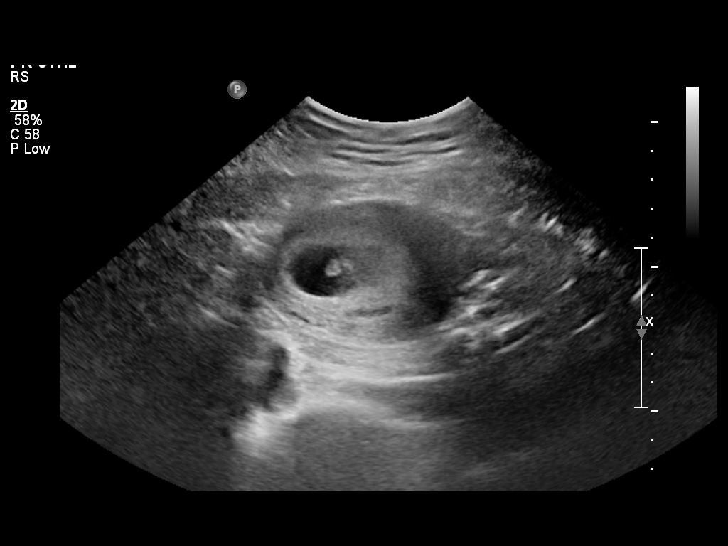
[im 8/22]
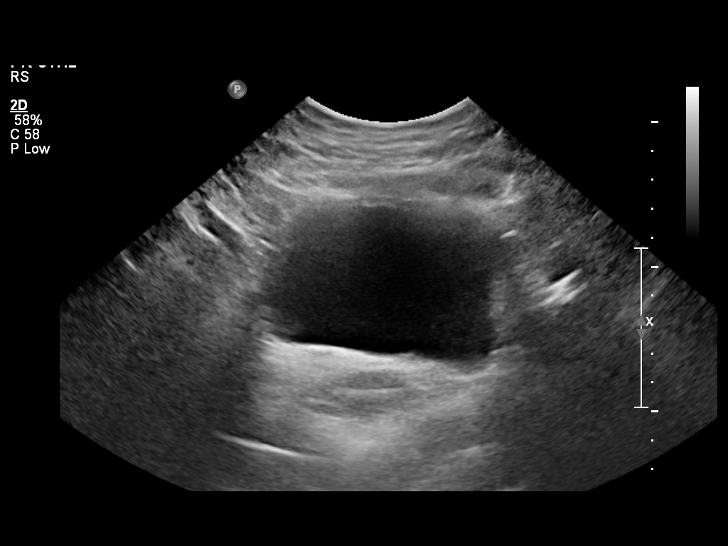
[im 9/22]
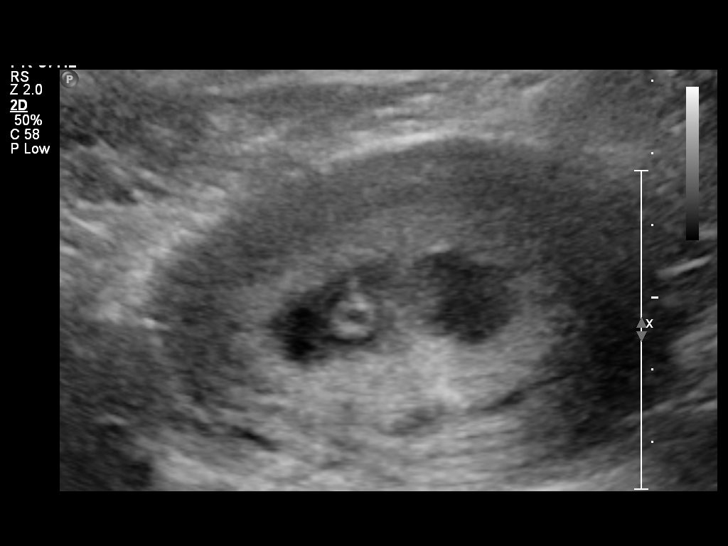
[im 11/22]
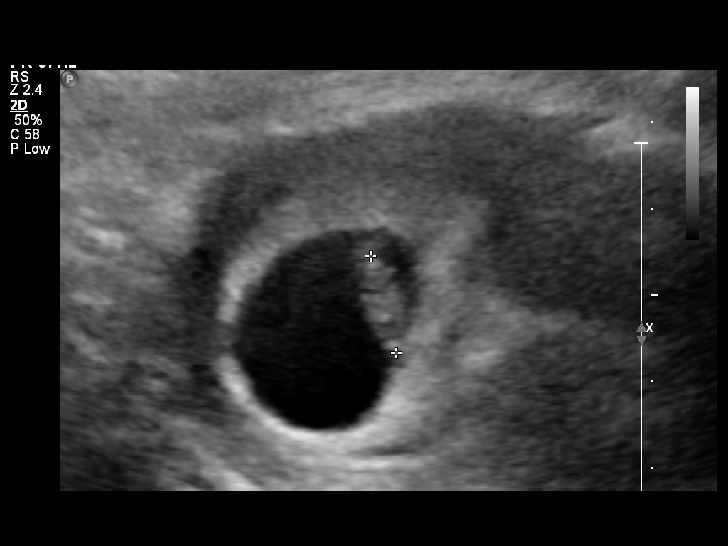
[im 12/22]
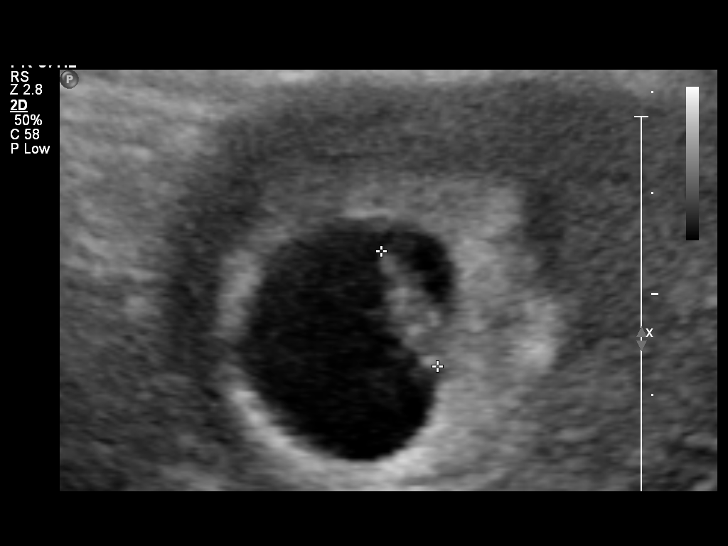
[im 14/22]
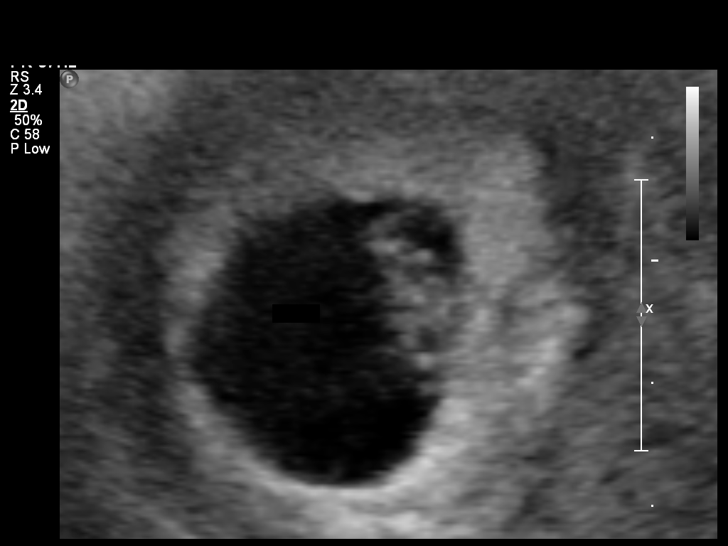
[im 15/22]
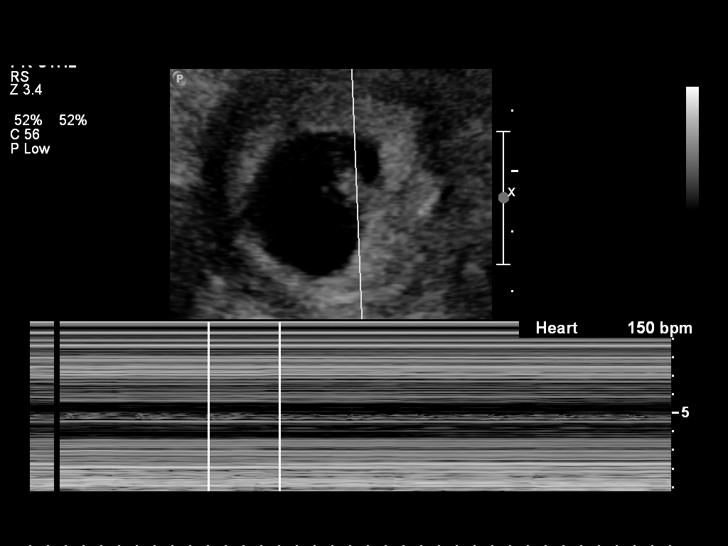
[im 17/22]
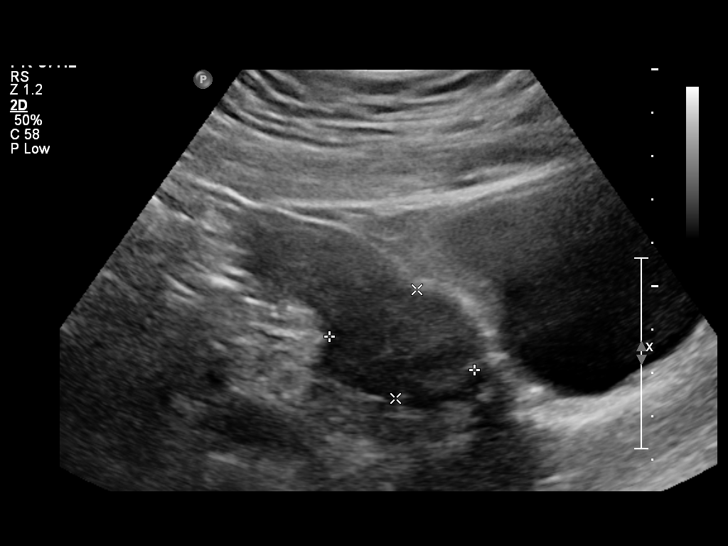
[im 19/22]
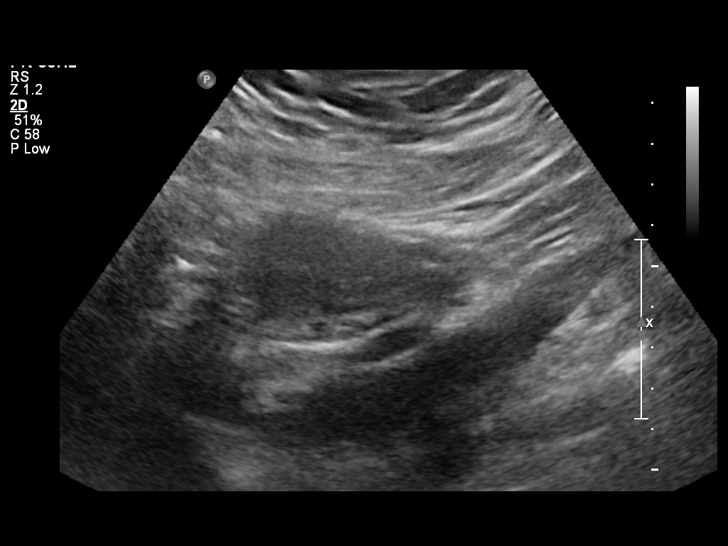
[im 20/22]
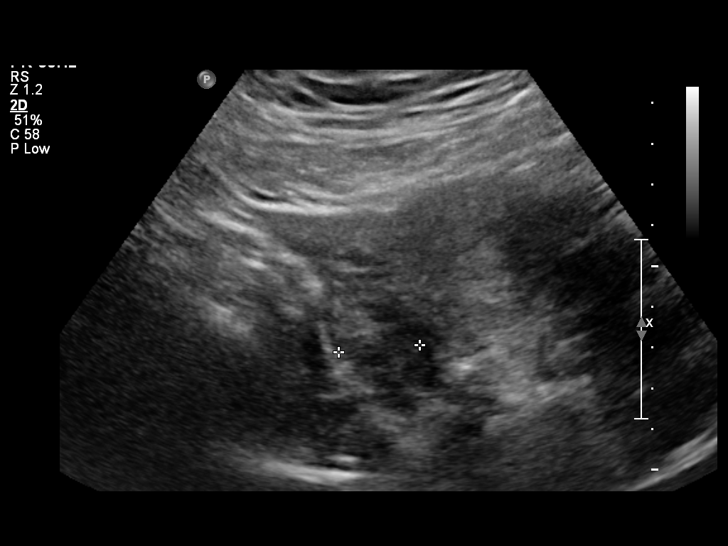
[im 22/22]
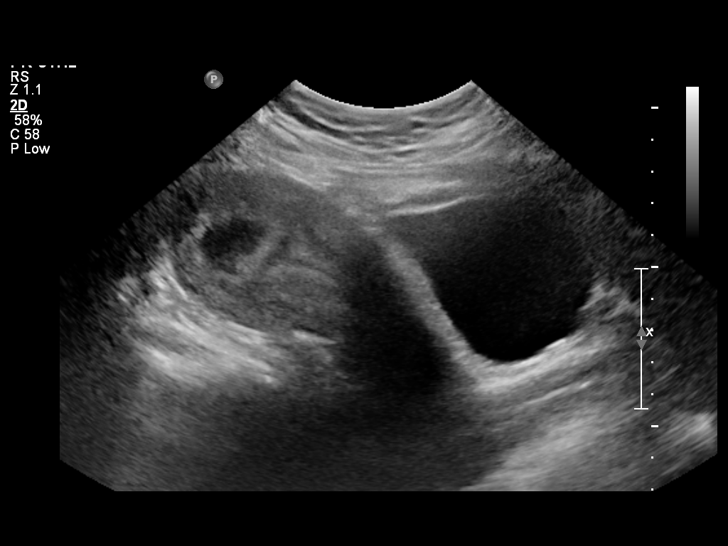

[14 of 22 positions shown; findings below may reference images not displayed]

IMPRESSION: See AS Obstetric US report.

## 2011-04-08 LAB — GC/CHLAMYDIA PROBE AMP, GENITAL: Chlamydia, DNA Probe: NEGATIVE

## 2011-04-08 LAB — RH IMMUNE GLOBULIN WORKUP (NOT WOMEN'S HOSP)
ABO/RH(D): A NEG
Antibody Screen: NEGATIVE

## 2011-04-08 LAB — CBC
HCT: 43.4
Hemoglobin: 14.5
RBC: 4.45
RDW: 12.9
WBC: 12.1 — ABNORMAL HIGH

## 2011-04-08 LAB — WET PREP, GENITAL: Clue Cells Wet Prep HPF POC: NONE SEEN

## 2011-04-09 LAB — URINE CULTURE: Colony Count: >=100000

## 2011-04-09 LAB — URINALYSIS, ROUTINE W REFLEX MICROSCOPIC
Bilirubin Urine: NEGATIVE
Nitrite: POSITIVE — AB
Specific Gravity, Urine: <1.005 — ABNORMAL LOW
Urobilinogen, UA: 0.2
pH: 6

## 2011-04-09 LAB — URINE MICROSCOPIC-ADD ON

## 2011-04-09 LAB — PREGNANCY, URINE: Preg Test, Ur: POSITIVE

## 2011-04-16 LAB — CBC
HCT: 40.2
Hemoglobin: 13.3
Hemoglobin: 14.1
MCHC: 34.7
Platelets: 163
Platelets: 196
RBC: 3.34 — ABNORMAL LOW
RBC: 4.13
RDW: 14.1
WBC: 10.5
WBC: 13.6 — ABNORMAL HIGH
WBC: 14 — ABNORMAL HIGH

## 2011-04-16 LAB — RH IMMUNE GLOB WKUP(>/=20WKS)(NOT WOMEN'S HOSP): Fetal Screen: NEGATIVE

## 2011-04-16 LAB — COMPREHENSIVE METABOLIC PANEL
Albumin: 2.8 — ABNORMAL LOW
BUN: 4 — ABNORMAL LOW
Calcium: 9.5
Glucose, Bld: 105 — ABNORMAL HIGH
Total Protein: 5.8 — ABNORMAL LOW

## 2011-04-16 LAB — CREATININE, SERUM
Creatinine, Ser: 0.69
GFR calc Af Amer: >60
GFR calc non Af Amer: >60

## 2011-04-16 LAB — TYPE AND SCREEN: Antibody Screen: NEGATIVE

## 2011-04-16 LAB — LACTATE DEHYDROGENASE: LDH: 146

## 2011-04-16 LAB — CCBB MATERNAL DONOR DRAW

## 2011-04-16 LAB — ABO/RH: ABO/RH(D): A NEG

## 2011-05-07 IMAGING — US US FETAL NUCHAL TRANSLUCENCY MEASUREMENT
1 series · 14 of 28 positions shown · non-contrast
Comparison: none

OBSTETRICAL ULTRASOUND:
 This ultrasound was performed in The [HOSPITAL], and the AS OB/GYN report will be stored to [REDACTED] PACS.

[Series 1: us fetal nuchal translucency measurement · 14 of 30 slices shown]
[im 2/30]
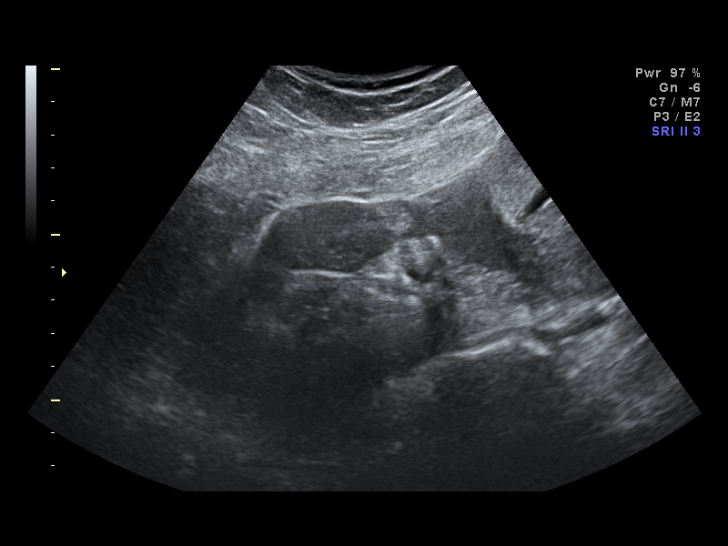
[im 4/30]
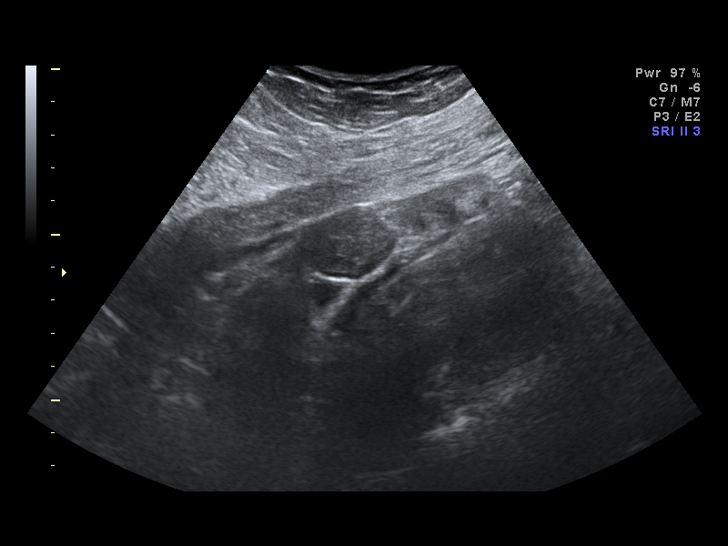
[im 6/30]
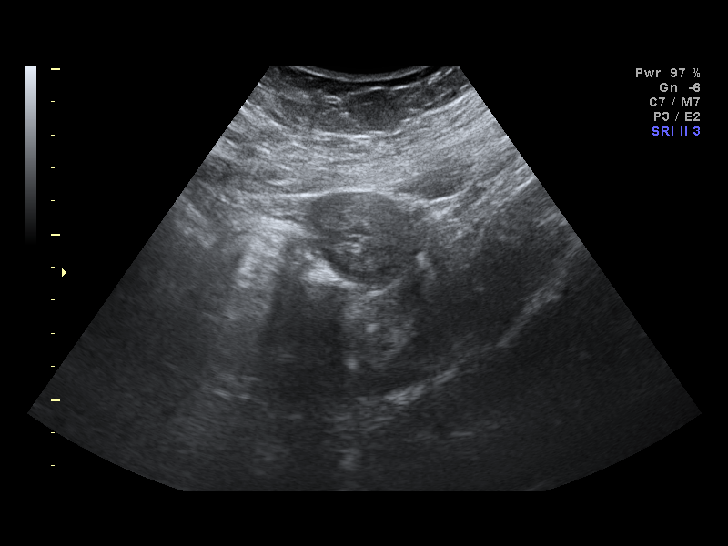
[im 8/30]
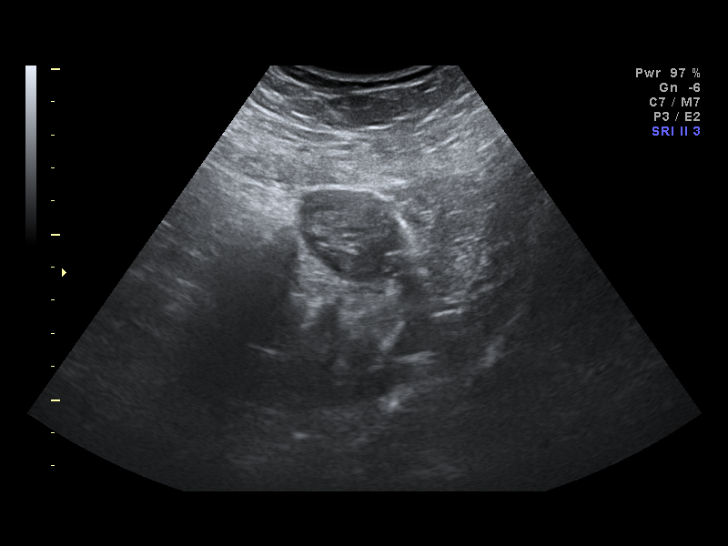
[im 10/30]
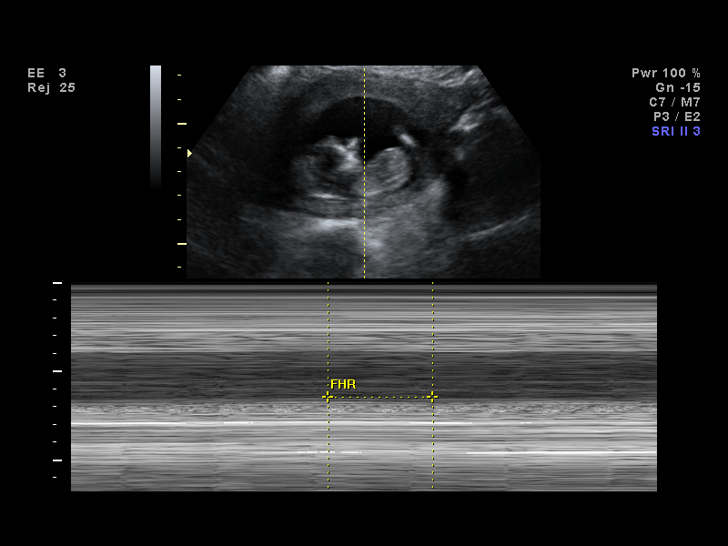
[im 12/30]
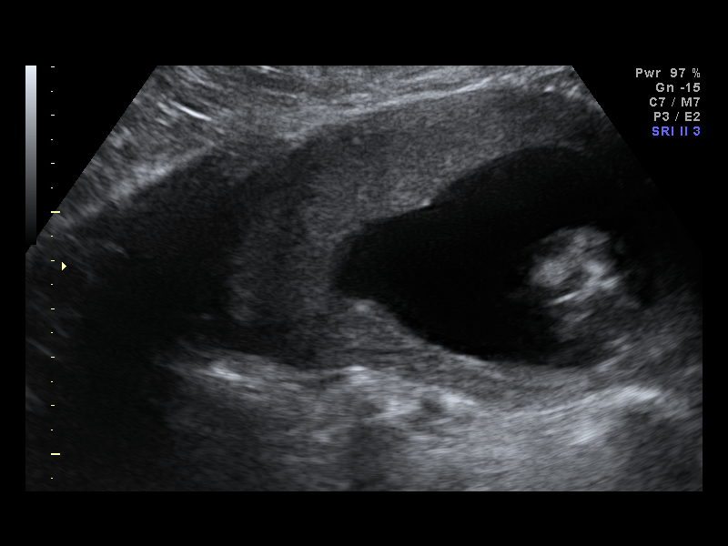
[im 14/30]
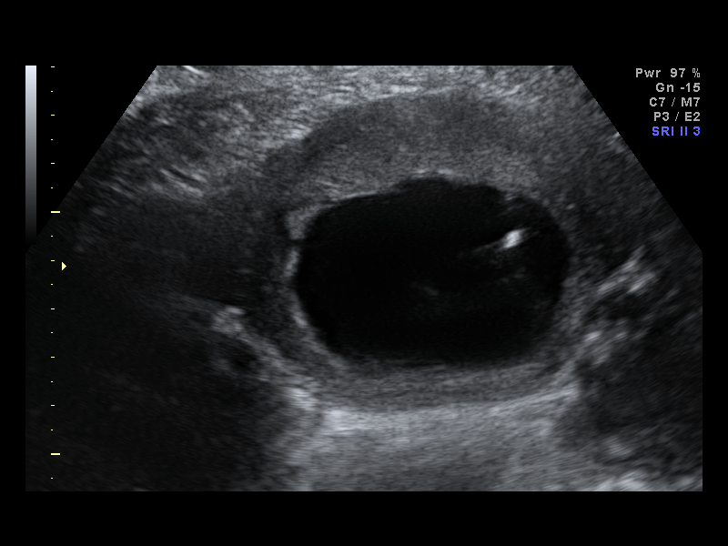
[im 17/30]
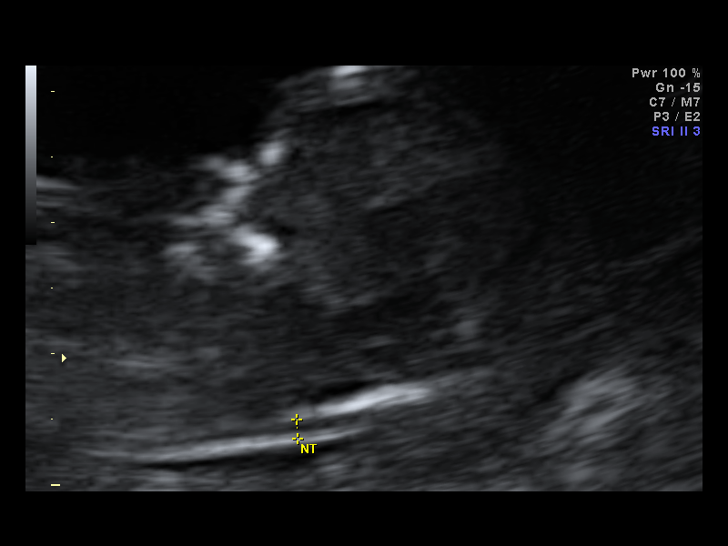
[im 19/30]
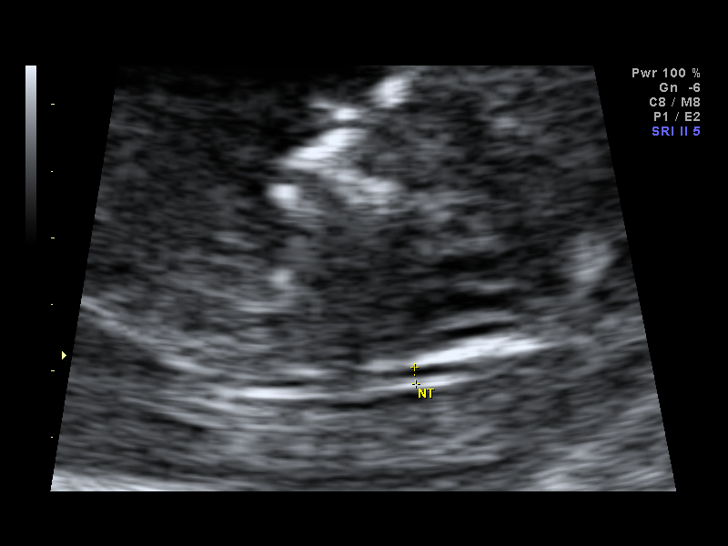
[im 21/30]
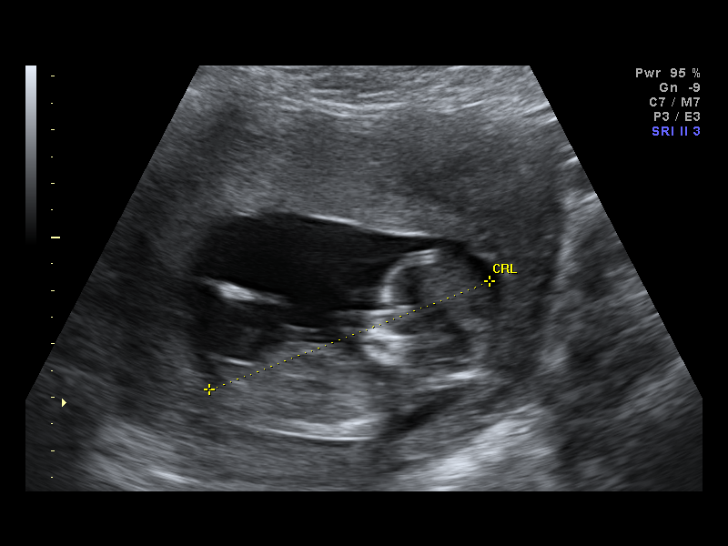
[im 23/30]
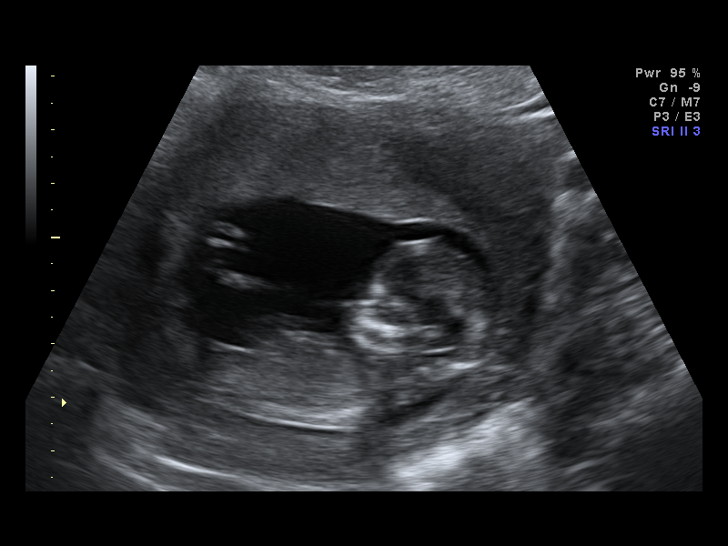
[im 25/30]
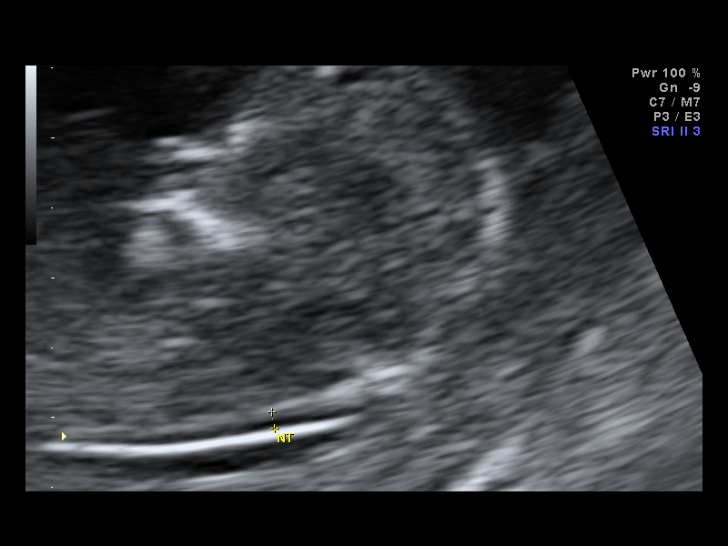
[im 27/30]
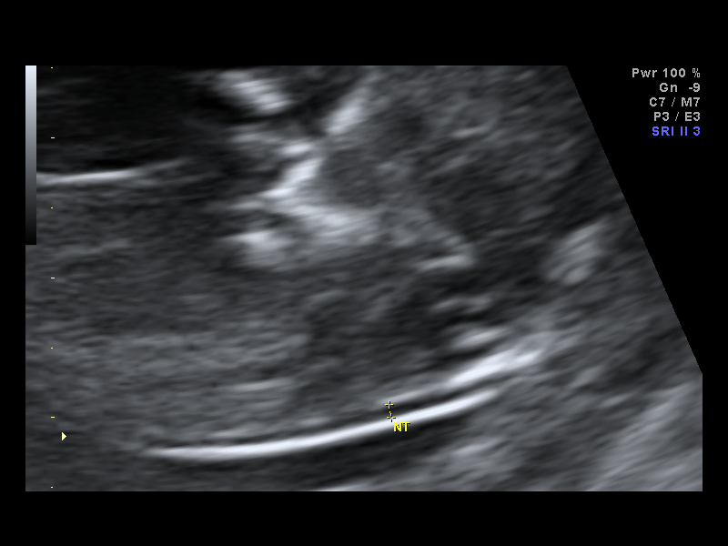
[im 30/30]
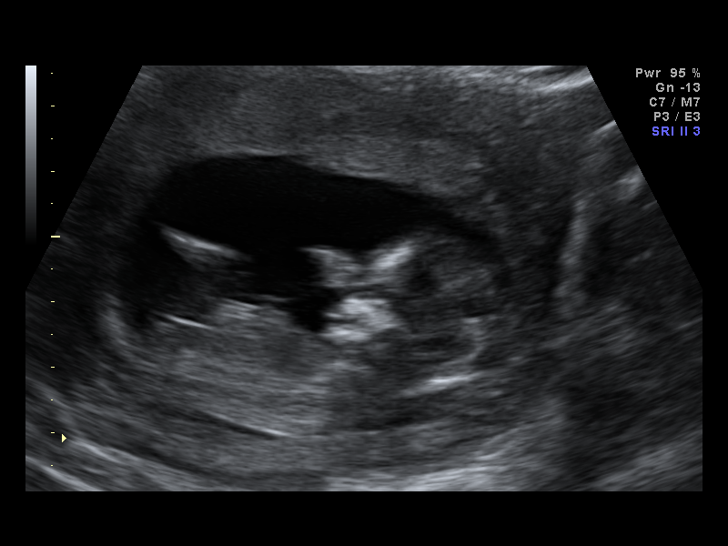

[14 of 28 positions shown; findings below may reference images not displayed]

IMPRESSION: AS OB/GYN has also been faxed to the ordering physician.

## 2011-05-23 ENCOUNTER — Encounter (HOSPITAL_COMMUNITY): Payer: Self-pay

## 2011-05-23 ENCOUNTER — Inpatient Hospital Stay (HOSPITAL_COMMUNITY)
Admission: AD | Admit: 2011-05-23 | Discharge: 2011-05-23 | Disposition: A | Discharge status: Home / Self Care | Payer: Medicaid Other | Source: Ambulatory Visit | Attending: Obstetrics & Gynecology | Admitting: Obstetrics & Gynecology

## 2011-05-23 DIAGNOSIS — O368190 Decreased fetal movements, unspecified trimester, not applicable or unspecified: Secondary | ICD-10-CM | POA: Insufficient documentation

## 2011-05-23 DIAGNOSIS — Z36 Encounter for antenatal screening of mother: Secondary | ICD-10-CM

## 2011-05-23 DIAGNOSIS — Z3689 Encounter for other specified antenatal screening: Secondary | ICD-10-CM

## 2011-05-23 NOTE — Progress Notes (Signed)
Pt states she has had decreased FM x 1 week, was told @ MD appt last week she had anterior placenta.  Pt C/O upper abd pain earlier today but none now, no bleeding.

## 2011-05-23 NOTE — ED Provider Notes (Signed)
History     Chief Complaint  Patient presents with  . Decreased Fetal Movement   HPI Pt states for last week has noted decreased fm, kicks not as strong.  Report feeling approximately 3-4 movements prior to arrival. Called MD, told to come to MAU for eval.  Denies any abdominal pain, vaginal bleeding, or leaking of fluid.  "lots" of fetal movement felt since the placement of the fetal monitor belts.     Past Medical History  Diagnosis Date  . No pertinent past medical history     Past Surgical History  Procedure Date  . Cesarean section     No family history on file.  History  Substance Use Topics  . Smoking status: Never Smoker   . Smokeless tobacco: Not on file  . Alcohol Use: No    Allergies: Allergies not on file  No prescriptions prior to admission    Review of Systems  Constitutional:       Decreased fetal movement  All other systems reviewed and are negative.   Physical Exam   Blood pressure 123/68, pulse 107, temperature 98.1 F (36.7 C), temperature source Oral, resp. rate 16, height 5\' 5"  (1.651 m), weight 93.611 kg (206 lb 6 oz).  Physical Exam  Constitutional: She is oriented to person, place, and time. She appears well-developed and well-nourished.  HENT:  Head: Normocephalic.  Neck: Normal range of motion. Neck supple.  Cardiovascular: Normal rate, regular rhythm and normal heart sounds.   Respiratory: Effort normal and breath sounds normal.  Genitourinary: No bleeding around the vagina. Vaginal discharge (mucusy) found.       Cervix - closed  Neurological: She is alert and oriented to person, place, and time.  Skin: Skin is warm and dry.    MAU Course  Procedures  NST - reactive, FHR 120's, 15x15, no decels  Assessment and Plan  Category I FHR Tracing  Plan: DC to home Provide reassurance  [facility] 05/23/2011, 6:39 PM

## 2011-05-23 NOTE — Progress Notes (Signed)
Pt states for last week has noted decreased fm, kicks not as strong. Called MD, told to come to [facility] for eval. Denies pain or bleeding or vag d/c changes.

## 2011-06-12 ENCOUNTER — Inpatient Hospital Stay (HOSPITAL_COMMUNITY)
Admission: AD | Admit: 2011-06-12 | Discharge: 2011-06-13 | Disposition: A | Discharge status: Home / Self Care | Source: Ambulatory Visit | Attending: Obstetrics & Gynecology | Admitting: Obstetrics & Gynecology

## 2011-06-12 ENCOUNTER — Encounter (HOSPITAL_COMMUNITY): Payer: Self-pay | Admitting: *Deleted

## 2011-06-12 DIAGNOSIS — Z3480 Encounter for supervision of other normal pregnancy, unspecified trimester: Secondary | ICD-10-CM

## 2011-06-12 DIAGNOSIS — O26859 Spotting complicating pregnancy, unspecified trimester: Secondary | ICD-10-CM | POA: Insufficient documentation

## 2011-06-12 DIAGNOSIS — N930 Postcoital and contact bleeding: Secondary | ICD-10-CM

## 2011-06-12 HISTORY — DX: Interstitial cystitis (chronic) without hematuria: N30.10

## 2011-06-12 LAB — URINALYSIS, ROUTINE W REFLEX MICROSCOPIC
Glucose, UA: NEGATIVE mg/dL
Hgb urine dipstick: NEGATIVE
Protein, ur: NEGATIVE mg/dL

## 2011-06-12 LAB — URINE MICROSCOPIC-ADD ON

## 2011-06-12 NOTE — Progress Notes (Signed)
Pt reports yesterday am she had bright red blood on tissue, then it stopped. Tonight started have bright red blood on tissue again. "achy" feeling in lower abd and lower back.

## 2011-06-12 NOTE — ED Provider Notes (Signed)
History     Chief Complaint  Patient presents with  . Vaginal Bleeding   HPI 25 year old female atG 4 P 2012 Estimated Date of Delivery: 08/08/11 now at 31 weeks delivery with vaginal spotting minimal yesterday am and minimal today.  She had intercourse last night.  No pain or cramping, no fluid reports good fetal movement.     Past Medical History  Diagnosis Date  . Interstitial cystitis     Past Surgical History  Procedure Date  . Cesarean section     History reviewed. No pertinent family history.  History  Substance Use Topics  . Smoking status: Never Smoker   . Smokeless tobacco: Not on file  . Alcohol Use: No    Allergies: No Known Allergies  Prescriptions prior to admission  Medication Sig Dispense Refill  . calcium carbonate (TUMS - DOSED IN MG ELEMENTAL CALCIUM) 500 MG chewable tablet Chew 1 tablet by mouth daily as needed. heartburn       . prenatal vitamin w/FE, FA (PRENATAL 1 + 1) 27-1 MG TABS Take 1 tablet by mouth daily.          ROS Physical Exam   Blood pressure 119/72, pulse 85, temperature 98.3 F (36.8 C), resp. rate 22, height 5\' 5"  (1.651 m), weight 207 lb (93.895 kg), SpO2 99.00%, unknown if currently breastfeeding.  Physical Exam  Reactive NST Pelvic Vagina has no bleeding present at all, no evidence of any recent bleeding, no discharge, no evidence of vaginitis Cervix long thick closed  Ultrasound from office reviewed and anterior placenta, no previa MAU Course  Procedures   Assessment and Plan  31+ weeks gestation with minimal vaginal spotting now resolved status post intercourse last evening Reassuring fetal status  Pelvic rest for 72 hours, keep office appointment at [facility] Follow up sooner if bleeding worsens or other problems occur  EURE,LUTHER H 06/12/2011, 11:51 PM

## 2011-06-13 MED ORDER — NITROFURANTOIN MACROCRYSTAL 100 MG PO CAPS: 100.0000 mg | ORAL_CAPSULE | Freq: Two times a day (BID) | ORAL | Status: AC

## 2011-06-13 NOTE — Progress Notes (Signed)
Dr. Despina Hidden at bedside.  Assessment done and poc discussed with pt.

## 2011-06-25 IMAGING — US US OB DETAIL + 14 WK
2 series · 14 of 28 positions shown · non-contrast
Comparison: none

OBSTETRICAL ULTRASOUND:
 This ultrasound exam was performed in the [HOSPITAL] Ultrasound Department.  The OB US report was generated in the AS system, and faxed to the ordering physician.  This report is also available in [HOSPITAL]?s [employer] and in [REDACTED] PACS.

[Series 1: us ob detail +14 wk · 65 acquisitions, 12 frames shown (1 of 2)]
[im 3/65]
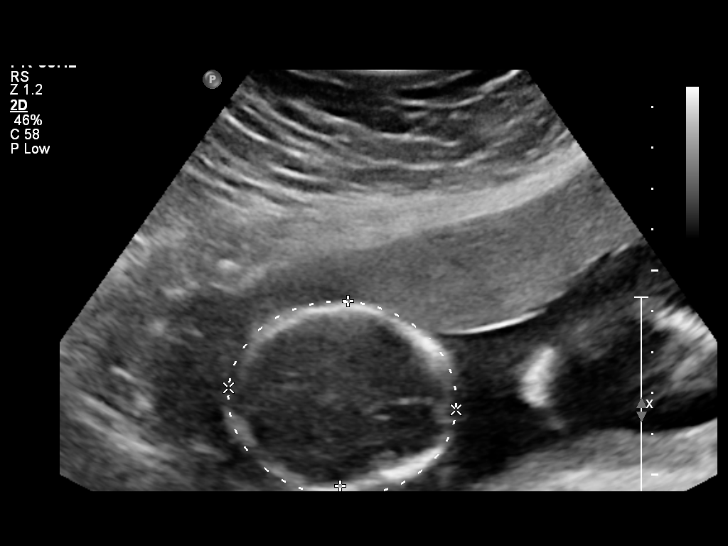
[im 9/65]
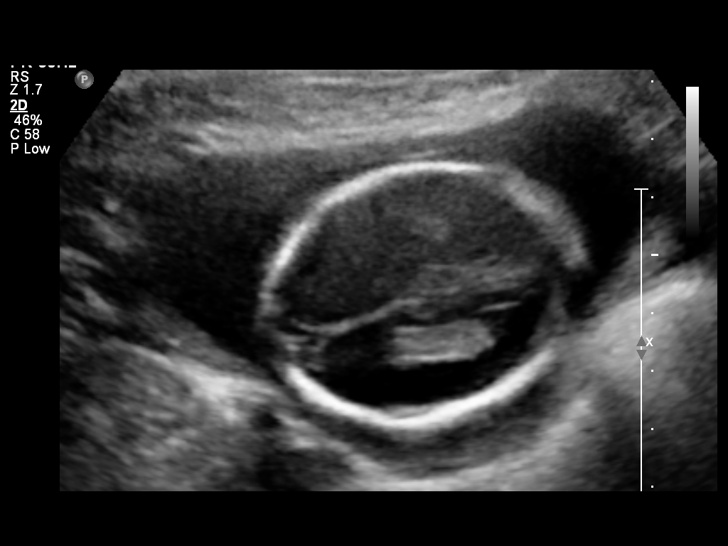
[im 14/65]
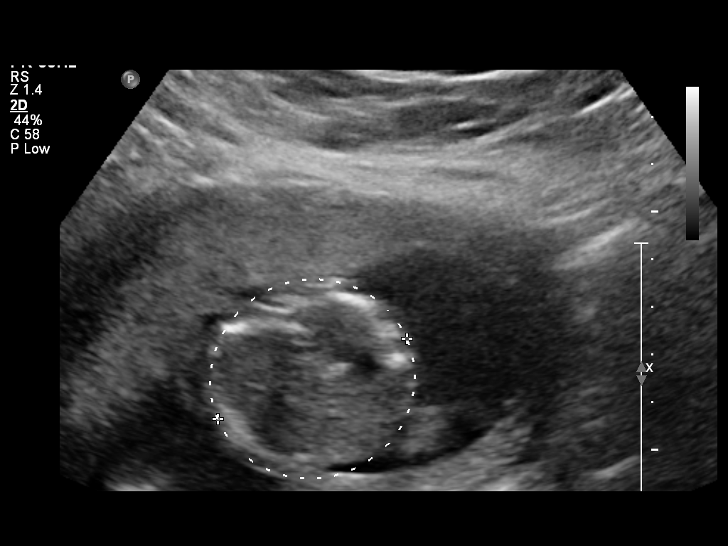
[im 20/65]
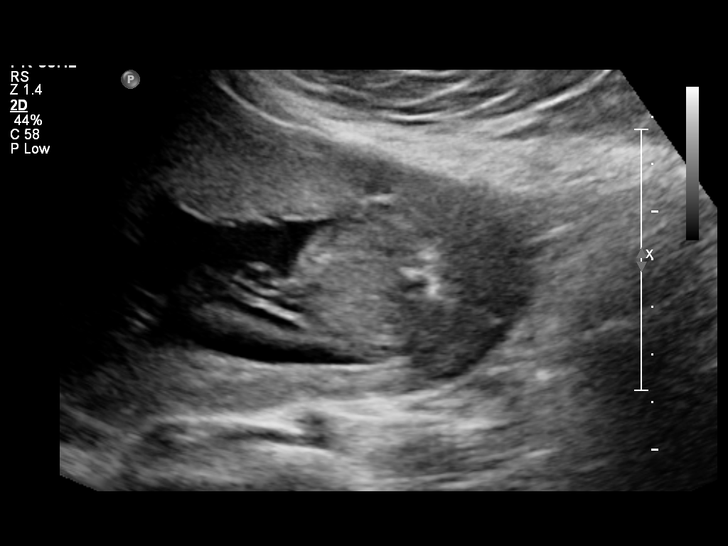
[im 26/65]
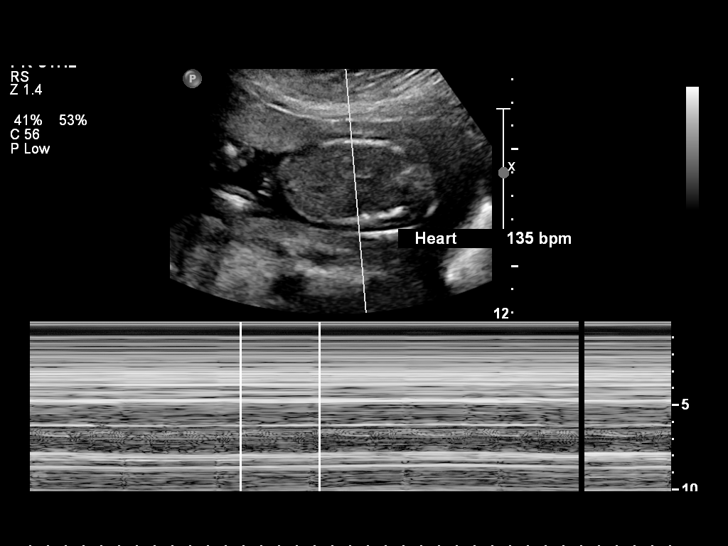
[im 31/65]
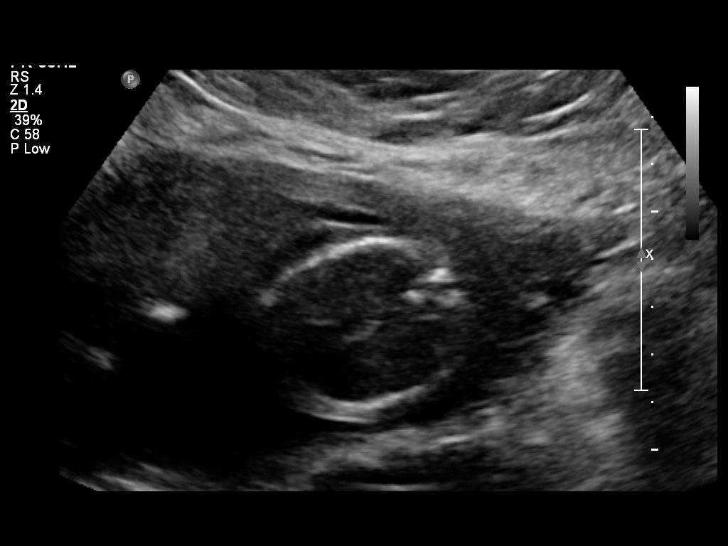
[im 37/65]
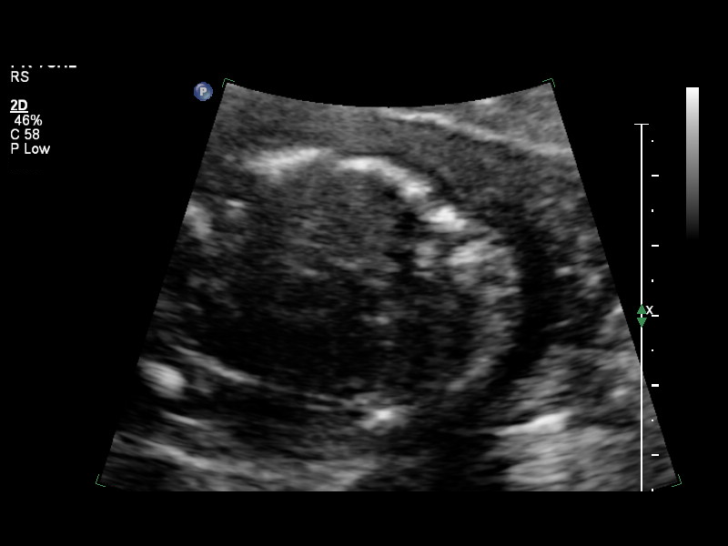
[im 42/65]
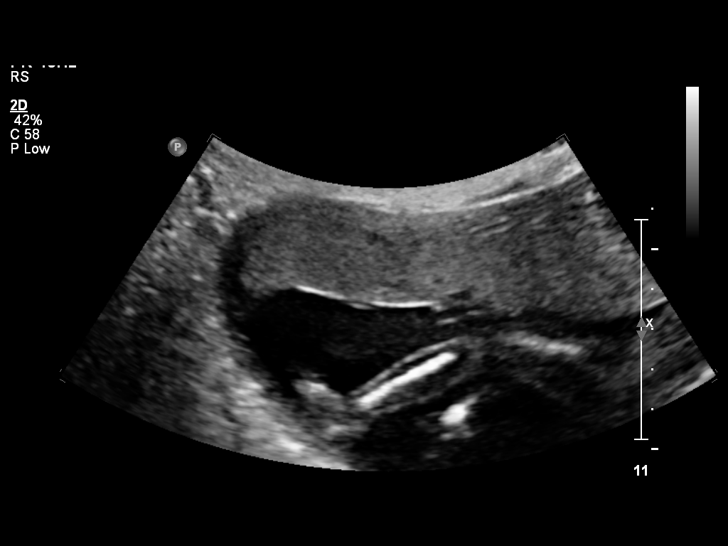
[im 48/65]
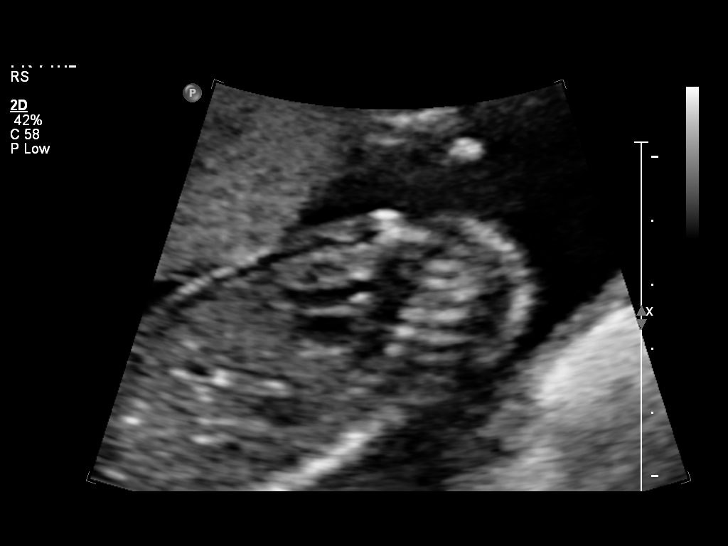
[im 53/65]
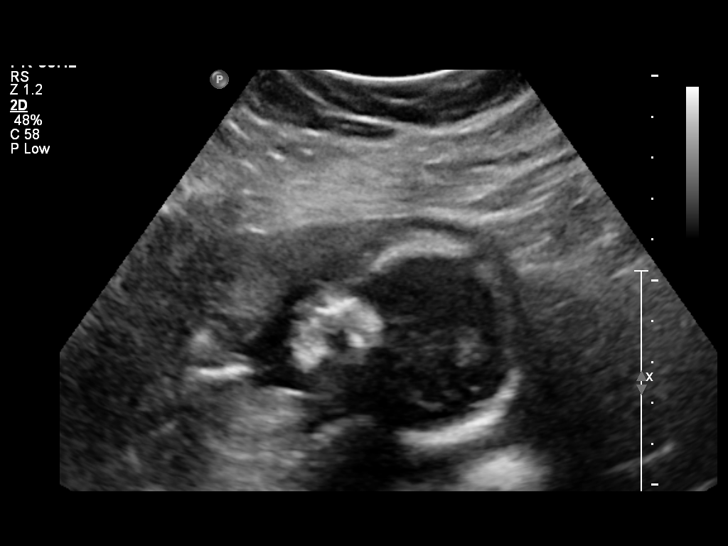
[im 59/65]
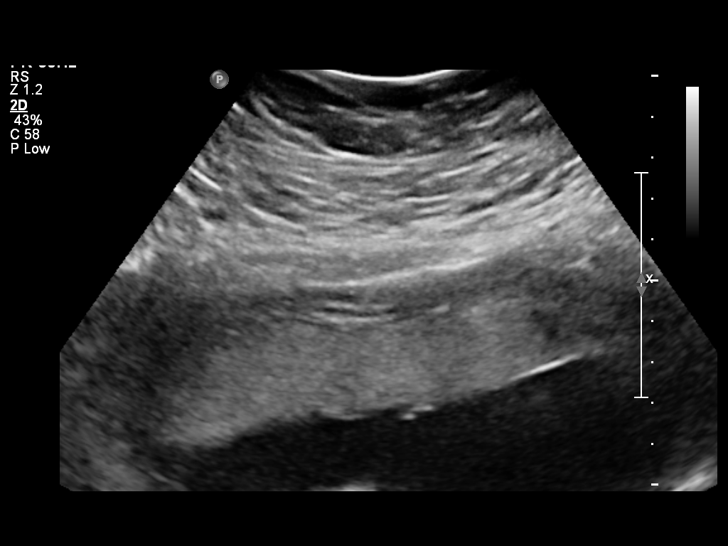
[im 65/65]
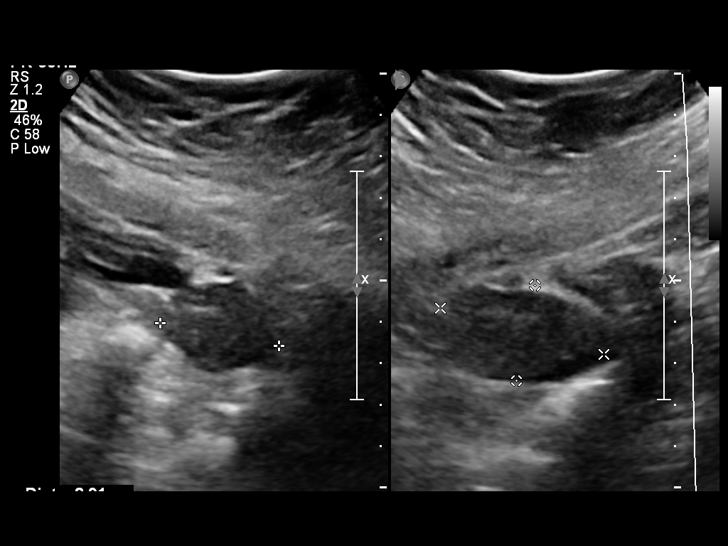

[Series 1: us ob detail +14 wk · 2 of 10 slices shown (2 of 2)]
[im 4/10]
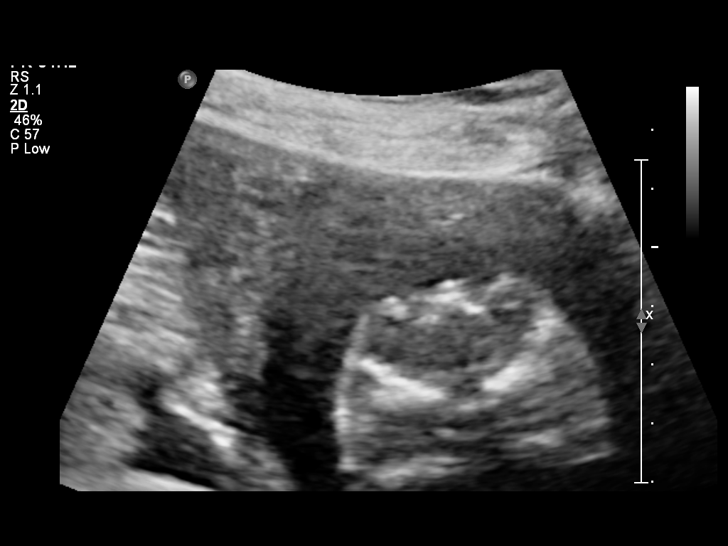
[im 10/10]
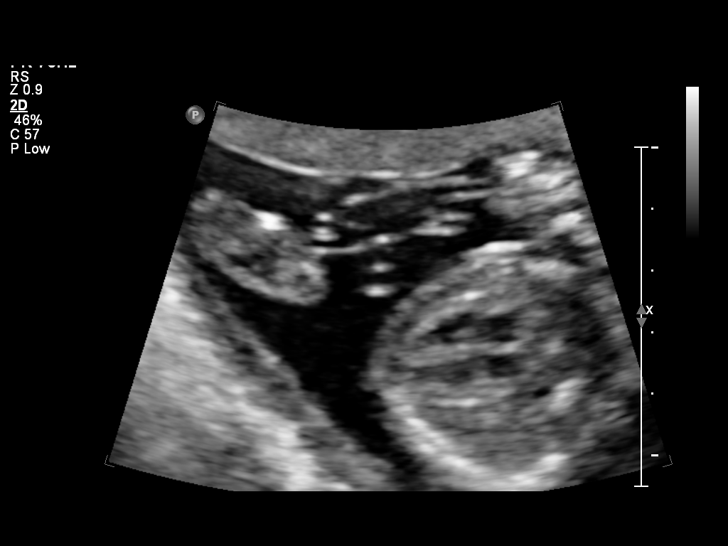

[14 of 28 positions shown; findings below may reference images not displayed]

IMPRESSION: See AS Obstetric US report.

## 2011-07-09 NOTE — L&D Delivery Note (Signed)
Delivery Note At 8:23 AM a viable female was delivered via Vaginal, Spontaneous Delivery (Presentation: Right Occiput Posterior).  APGAR: 7, 9; weight 7 lb 8.3 oz (3410 g).   Placenta status: Intact, Spontaneous.  Cord: 3 vessels with the following complications: loose nuchal cord x 1.  Venous Cord pH: pending Terminal meconium   Anesthesia: Epidural  Episiotomy: None Lacerations: None Suture Repair: NA Est. Blood Loss (mL): 300  Mom to postpartum.  Baby to nursery-stable. Placenta to: birthing suites Feeding: bottle Circ: OP Contraception: ?Nicole Burgess, Nicole Burgess 08/14/2011, 8:45 AM

## 2011-08-12 ENCOUNTER — Telehealth (HOSPITAL_COMMUNITY): Payer: Self-pay | Admitting: *Deleted

## 2011-08-12 ENCOUNTER — Encounter (HOSPITAL_COMMUNITY): Payer: Self-pay | Admitting: *Deleted

## 2011-08-12 NOTE — Telephone Encounter (Signed)
Preadmission screen  

## 2011-08-13 ENCOUNTER — Inpatient Hospital Stay (HOSPITAL_COMMUNITY)
Admission: AD | Admit: 2011-08-13 | Discharge: 2011-08-16 | DRG: 775 | Disposition: A | Discharge status: Home / Self Care | Payer: Medicaid Other | Source: Ambulatory Visit | Attending: Obstetrics & Gynecology | Admitting: Obstetrics & Gynecology

## 2011-08-13 ENCOUNTER — Encounter (HOSPITAL_COMMUNITY): Payer: Self-pay | Admitting: *Deleted

## 2011-08-13 DIAGNOSIS — O34219 Maternal care for unspecified type scar from previous cesarean delivery: Principal | ICD-10-CM | POA: Diagnosis present

## 2011-08-13 DIAGNOSIS — IMO0001 Reserved for inherently not codable concepts without codable children: Secondary | ICD-10-CM

## 2011-08-13 NOTE — Progress Notes (Signed)
Pt reports she started leaking fluid at 1600, contractions x 2 hours. Last sve closed

## 2011-08-13 NOTE — H&P (Signed)
Nicole Burgess is a 26 y.o. year old [id number] female at [redacted]w[redacted]d weeks gestation who presents to MAU reporting Spontaneous rupture of membranes at 1600 and Labor. She reports pos FM and denies VB and states that her cervix was closed yesterday.  History OB History    Grav Para Term Preterm Abortions TAB SAB Ect Mult Living   4 2 2  1  1   2      Past Medical History  Diagnosis Date  . Interstitial cystitis   . Herpes    Past Surgical History  Procedure Date  . Cesarean section    Family History: family history includes Diabetes in her paternal grandmother and Mental retardation in her mother. Social History:  reports that she has never smoked. She has never used smokeless tobacco. She reports that she does not drink alcohol or use illicit drugs.  Review of Systems  Constitutional: Negative for fever and chills.  Eyes: Negative for blurred vision.  Neurological: Negative for headaches.    Dilation: 3 Effacement (%): 50 Station: -3 Exam by:: Ivonne Andrew, CNM Blood pressure 132/69, pulse 104, temperature 98.6 F (37 C), resp. rate 18, height 5\' 4"  (1.626 m), weight 97.977 kg (216 lb), unknown if currently breastfeeding. Maternal Exam:  Uterine Assessment: Contraction strength is moderate.  Contraction frequency is regular.  Q4-5  Abdomen: Surgical scars: low transverse.   Fundal height is S=D.   Fetal presentation: vertex  Introitus: Normal vulva. Vulva is negative for lesion.  Normal vagina.  Ferning test: positive.  Amniotic fluid character: clear. Pos pool  Pelvis: adequate for delivery.   Cervix: Cervix evaluated by sterile speculum exam and digital exam.     Fetal Exam Fetal Monitor Review: Mode: ultrasound.   Baseline rate: 120.  Variability: moderate (6-25 bpm).   Pattern: accelerations present and no decelerations.    Fetal State Assessment: Category I - tracings are normal.     Physical Exam  Nursing note and vitals reviewed. Constitutional: She is oriented to  person, place, and time. She appears well-developed and well-nourished. She appears distressed (mildly).  HENT:  Head: Normocephalic.  Eyes: Pupils are equal, round, and reactive to light.  Cardiovascular: Normal rate.   Respiratory: Effort normal.  GI: Soft. There is no tenderness.  Genitourinary: Vagina normal and uterus normal. There is no lesion on the right labia. There is no lesion on the left labia. Vulva exhibits no lesion. No bleeding around the vagina.  Musculoskeletal: Normal range of motion. She exhibits no edema.  Neurological: She is alert and oriented to person, place, and time. She has normal reflexes.  Skin: Skin is warm and dry.  Psychiatric: She has a normal mood and affect.    Prenatal labs: ABO, Rh: --/--/A NEG (07/02 1810) Antibody: NEG (07/02 1810), pos anti-D 01/29/12 Rubella:  Immune RPR: NON REACTIVE (07/02 1810)  HBsAg:   Neg HIV: Non-reactive (07/24 0000)  GBS: Negative (01/11 0000)  Integrated screen normal 2 hour GTT normal: 73, 148, 126   Assessment: 1. Labor: early-active 2. Fetal Wellbeing: Category 1  3. Pain Control: none, plans epidural 4. GBS: neg 5. 40.5 week IUP 6. Hx C/S, followed by successful VBAC 7. 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 No evidence of outbreak or prodrome  Plan:  1. Admit to BS per consult with MD 2. Routine L&D orders 3. Analgesia/anesthesia PRN  4. Expectant management. If no change in 2 hours, will start pitocin augmentation  Luman Holway 08/13/2011, 11:48 PM

## 2011-08-13 NOTE — Progress Notes (Signed)
CNM notified of pt presenting with ?ROM.  Notified of no ferning seen on slide.  Will be down to assess pt.

## 2011-08-13 NOTE — Progress Notes (Signed)
V. Smith, CNM at bedside.  Assessment done and poc discussed with pt.  

## 2011-08-14 ENCOUNTER — Encounter (HOSPITAL_COMMUNITY): Payer: Self-pay

## 2011-08-14 ENCOUNTER — Encounter (HOSPITAL_COMMUNITY): Payer: Self-pay | Admitting: Anesthesiology

## 2011-08-14 ENCOUNTER — Inpatient Hospital Stay (HOSPITAL_COMMUNITY): Payer: Medicaid Other | Admitting: Anesthesiology

## 2011-08-14 DIAGNOSIS — O34219 Maternal care for unspecified type scar from previous cesarean delivery: Secondary | ICD-10-CM

## 2011-08-14 LAB — CBC
HCT: 38.7 % (ref 36.0–46.0)
Hemoglobin: 13.3 g/dL (ref 12.0–15.0)
MCH: 33.7 pg (ref 26.0–34.0)
MCHC: 34.4 g/dL (ref 30.0–36.0)
MCV: 98 fL (ref 78.0–100.0)

## 2011-08-14 LAB — RPR: RPR Ser Ql: NONREACTIVE

## 2011-08-14 MED ORDER — PRENATAL MULTIVITAMIN CH
1.0000 | ORAL_TABLET | Freq: Every day | ORAL | Status: DC
  Administered 2011-08-15 – 2011-08-16 (×2): 1 via ORAL
  Filled 2011-08-14 (×2): qty 1

## 2011-08-14 MED ORDER — EPHEDRINE 5 MG/ML INJ: 10.0000 mg | INTRAVENOUS | Status: DC | PRN

## 2011-08-14 MED ORDER — LANOLIN HYDROUS EX OINT: 1.0000 "application " | TOPICAL_OINTMENT | CUTANEOUS | Status: DC | PRN

## 2011-08-14 MED ORDER — ACETAMINOPHEN 325 MG PO TABS: 650.0000 mg | ORAL_TABLET | ORAL | Status: DC | PRN

## 2011-08-14 MED ORDER — OXYTOCIN 20 UNITS IN LACTATED RINGERS INFUSION - SIMPLE
125.0000 mL/h | Freq: Once | INTRAVENOUS | Status: AC
  Administered 2011-08-14: 125 mL/h via INTRAVENOUS

## 2011-08-14 MED ORDER — TETANUS-DIPHTH-ACELL PERTUSSIS 5-2.5-18.5 LF-MCG/0.5 IM SUSP
0.5000 mL | Freq: Once | INTRAMUSCULAR | Status: AC
  Administered 2011-08-15: 0.5 mL via INTRAMUSCULAR
  Filled 2011-08-14: qty 0.5

## 2011-08-14 MED ORDER — PHENYLEPHRINE 40 MCG/ML (10ML) SYRINGE FOR IV PUSH (FOR BLOOD PRESSURE SUPPORT)
80.0000 ug | PREFILLED_SYRINGE | INTRAVENOUS | Status: DC | PRN
  Administered 2011-08-14: 80 ug via INTRAVENOUS
  Filled 2011-08-14: qty 5

## 2011-08-14 MED ORDER — ONDANSETRON HCL 4 MG/2ML IJ SOLN: 4.0000 mg | INTRAMUSCULAR | Status: DC | PRN

## 2011-08-14 MED ORDER — WITCH HAZEL-GLYCERIN EX PADS: 1.0000 "application " | MEDICATED_PAD | CUTANEOUS | Status: DC | PRN

## 2011-08-14 MED ORDER — FERROUS SULFATE 325 (65 FE) MG PO TABS
325.0000 mg | ORAL_TABLET | Freq: Two times a day (BID) | ORAL | Status: DC
  Administered 2011-08-14 – 2011-08-16 (×4): 325 mg via ORAL
  Filled 2011-08-14 (×4): qty 1

## 2011-08-14 MED ORDER — IBUPROFEN 600 MG PO TABS
600.0000 mg | ORAL_TABLET | Freq: Four times a day (QID) | ORAL | Status: DC
  Administered 2011-08-14 – 2011-08-16 (×9): 600 mg via ORAL
  Filled 2011-08-14 (×9): qty 1

## 2011-08-14 MED ORDER — METHYLERGONOVINE MALEATE 0.2 MG/ML IJ SOLN: 0.2000 mg | INTRAMUSCULAR | Status: DC | PRN

## 2011-08-14 MED ORDER — DIPHENHYDRAMINE HCL 25 MG PO CAPS: 25.0000 mg | ORAL_CAPSULE | Freq: Four times a day (QID) | ORAL | Status: DC | PRN

## 2011-08-14 MED ORDER — NALBUPHINE SYRINGE 5 MG/0.5 ML
5.0000 mg | INJECTION | INTRAMUSCULAR | Status: DC | PRN
  Administered 2011-08-14: 5 mg via INTRAVENOUS
  Filled 2011-08-14 (×2): qty 0.5
  Filled 2011-08-14: qty 1

## 2011-08-14 MED ORDER — BENZOCAINE-MENTHOL 20-0.5 % EX AERO: 1.0000 "application " | INHALATION_SPRAY | CUTANEOUS | Status: DC | PRN

## 2011-08-14 MED ORDER — SIMETHICONE 80 MG PO CHEW: 80.0000 mg | CHEWABLE_TABLET | ORAL | Status: DC | PRN

## 2011-08-14 MED ORDER — LIDOCAINE HCL (PF) 1 % IJ SOLN
30.0000 mL | INTRAMUSCULAR | Status: DC | PRN
  Filled 2011-08-14: qty 30

## 2011-08-14 MED ORDER — HYDROXYZINE HCL 50 MG/ML IM SOLN: 50.0000 mg | Freq: Four times a day (QID) | INTRAMUSCULAR | Status: DC | PRN

## 2011-08-14 MED ORDER — DIPHENHYDRAMINE HCL 50 MG/ML IJ SOLN: 12.5000 mg | INTRAMUSCULAR | Status: DC | PRN

## 2011-08-14 MED ORDER — CITRIC ACID-SODIUM CITRATE 334-500 MG/5ML PO SOLN: 30.0000 mL | ORAL | Status: DC | PRN

## 2011-08-14 MED ORDER — OXYCODONE-ACETAMINOPHEN 5-325 MG PO TABS: 1.0000 | ORAL_TABLET | ORAL | Status: DC | PRN

## 2011-08-14 MED ORDER — ONDANSETRON HCL 4 MG/2ML IJ SOLN: 4.0000 mg | Freq: Four times a day (QID) | INTRAMUSCULAR | Status: DC | PRN

## 2011-08-14 MED ORDER — METHYLERGONOVINE MALEATE 0.2 MG PO TABS: 0.2000 mg | ORAL_TABLET | ORAL | Status: DC | PRN

## 2011-08-14 MED ORDER — LACTATED RINGERS IV SOLN: 500.0000 mL | INTRAVENOUS | Status: DC | PRN

## 2011-08-14 MED ORDER — FLEET ENEMA 7-19 GM/118ML RE ENEM: 1.0000 | ENEMA | RECTAL | Status: DC | PRN

## 2011-08-14 MED ORDER — OXYTOCIN BOLUS FROM INFUSION
500.0000 mL | Freq: Once | INTRAVENOUS | Status: AC
  Administered 2011-08-14: 500 mL via INTRAVENOUS
  Filled 2011-08-14: qty 500
  Filled 2011-08-14: qty 1000

## 2011-08-14 MED ORDER — MAGNESIUM HYDROXIDE 400 MG/5ML PO SUSP: 30.0000 mL | ORAL | Status: DC | PRN

## 2011-08-14 MED ORDER — FENTANYL 2.5 MCG/ML BUPIVACAINE 1/10 % EPIDURAL INFUSION (WH - ANES)
14.0000 mL/h | INTRAMUSCULAR | Status: DC
  Administered 2011-08-14: 14 mL/h via EPIDURAL
  Filled 2011-08-14 (×2): qty 60

## 2011-08-14 MED ORDER — LACTATED RINGERS IV SOLN
INTRAVENOUS | Status: DC
  Administered 2011-08-14 (×2): via INTRAVENOUS
  Administered 2011-08-14: 999 mL/h via INTRAVENOUS

## 2011-08-14 MED ORDER — PHENYLEPHRINE 40 MCG/ML (10ML) SYRINGE FOR IV PUSH (FOR BLOOD PRESSURE SUPPORT): 80.0000 ug | PREFILLED_SYRINGE | INTRAVENOUS | Status: DC | PRN

## 2011-08-14 MED ORDER — MEASLES, MUMPS & RUBELLA VAC ~~LOC~~ INJ
0.5000 mL | INJECTION | Freq: Once | SUBCUTANEOUS | Status: DC
  Filled 2011-08-14: qty 0.5

## 2011-08-14 MED ORDER — NALBUPHINE HCL 10 MG/ML IJ SOLN
5.0000 mg | Freq: Once | INTRAMUSCULAR | Status: DC
  Administered 2011-08-14: 5 mg via INTRAVENOUS

## 2011-08-14 MED ORDER — EPHEDRINE 5 MG/ML INJ
10.0000 mg | INTRAVENOUS | Status: DC | PRN
  Filled 2011-08-14: qty 4

## 2011-08-14 MED ORDER — ONDANSETRON HCL 4 MG PO TABS: 4.0000 mg | ORAL_TABLET | ORAL | Status: DC | PRN

## 2011-08-14 MED ORDER — DIBUCAINE 1 % RE OINT: 1.0000 "application " | TOPICAL_OINTMENT | RECTAL | Status: DC | PRN

## 2011-08-14 MED ORDER — LACTATED RINGERS IV SOLN
500.0000 mL | Freq: Once | INTRAVENOUS | Status: AC
  Administered 2011-08-14: 1000 mL via INTRAVENOUS

## 2011-08-14 MED ORDER — NALBUPHINE SYRINGE 5 MG/0.5 ML: 5.0000 mg | INJECTION | Freq: Once | INTRAMUSCULAR | Status: DC

## 2011-08-14 MED ORDER — SODIUM BICARBONATE 8.4 % IV SOLN
INTRAVENOUS | Status: DC | PRN
  Administered 2011-08-14: 4 mL via EPIDURAL

## 2011-08-14 MED ORDER — FENTANYL 2.5 MCG/ML BUPIVACAINE 1/10 % EPIDURAL INFUSION (WH - ANES)
INTRAMUSCULAR | Status: DC | PRN
  Administered 2011-08-14: 14 mL/h via EPIDURAL

## 2011-08-14 MED ORDER — ZOLPIDEM TARTRATE 5 MG PO TABS: 5.0000 mg | ORAL_TABLET | Freq: Every evening | ORAL | Status: DC | PRN

## 2011-08-14 MED ORDER — HYDROXYZINE HCL 50 MG PO TABS: 50.0000 mg | ORAL_TABLET | Freq: Four times a day (QID) | ORAL | Status: DC | PRN

## 2011-08-14 MED ORDER — SENNOSIDES-DOCUSATE SODIUM 8.6-50 MG PO TABS
2.0000 | ORAL_TABLET | Freq: Every day | ORAL | Status: DC
  Administered 2011-08-14 – 2011-08-15 (×2): 2 via ORAL

## 2011-08-14 MED ORDER — IBUPROFEN 600 MG PO TABS: 600.0000 mg | ORAL_TABLET | Freq: Four times a day (QID) | ORAL | Status: DC | PRN

## 2011-08-14 NOTE — Progress Notes (Signed)
Order received that is ok for the patient to walk in unit & use wireless telemetry monitors.

## 2011-08-14 NOTE — Progress Notes (Signed)
Nicole Burgess is a 26 y.o. [phone] at [redacted]w[redacted]d   Subjective: Requesting epidural  Objective: BP 128/62  Pulse 97  Temp(Src) 97.8 F (36.6 C) (Oral)  Resp 20  Ht 5\' 4"  (1.626 m)  Wt 97.977 kg (216 lb)  BMI 37.08 kg/m2  Breastfeeding? Unknown      FHT:  FHR: 115 bpm, variability: moderate,  accelerations:  Present,  decelerations:  Absent UC:   regular, every 2-3 minutes SVE:   Dilation: 4 Effacement (%): 90 Station: -3 Slightly oblique Exam by:: [employer]:  Assessment / Plan: Spontaneous, active labor, progressing normally  Labor: Progressing normally Preeclampsia:  NA Fetal Wellbeing:  Category I Pain Control:  Epidural I/D:  NA Anticipated MOD:  NSVD  Amond Speranza 08/14/2011, 4:25 AM

## 2011-08-14 NOTE — Progress Notes (Signed)
Pt to room 163 in stable condition at this time.

## 2011-08-14 NOTE — Progress Notes (Signed)
SSE per CNM. + pooling noted.

## 2011-08-14 NOTE — Anesthesia Procedure Notes (Signed)

## 2011-08-14 NOTE — Anesthesia Preprocedure Evaluation (Addendum)
Anesthesia Evaluation  Patient identified by MRN, date of birth, ID band Patient awake    Reviewed: Allergy & Precautions, H&P , Patient's Chart, lab work & pertinent test results  Airway Mallampati: III TM Distance: >3 FB Neck ROM: full    Dental  (+) Teeth Intact   Pulmonary  clear to auscultation        Cardiovascular regular Normal    Neuro/Psych    GI/Hepatic   Endo/Other  Morbid obesity  Renal/GU      Musculoskeletal   Abdominal   Peds  Hematology   Anesthesia Other Findings VBAC  Reproductive/Obstetrics (+) Pregnancy                          Anesthesia Physical Anesthesia Plan  ASA: III  Anesthesia Plan: Epidural   Post-op Pain Management:    Induction:   Airway Management Planned:   Additional Equipment:   Intra-op Plan:   Post-operative Plan:   Informed Consent: I have reviewed the patients History and Physical, chart, labs and discussed the procedure including the risks, benefits and alternatives for the proposed anesthesia with the patient or authorized representative who has indicated his/her understanding and acceptance.   Dental Advisory Given  Plan Discussed with:   Anesthesia Plan Comments: (Labs checked- platelets confirmed with RN in room. Fetal heart tracing, per RN, reported to be stable enough for sitting procedure. Discussed epidural, and patient consents to the procedure:  included risk of possible headache,backache, failed block, allergic reaction, and nerve injury. This patient was asked if she had any questions or concerns before the procedure started. )        Anesthesia Quick Evaluation

## 2011-08-14 NOTE — Progress Notes (Signed)
Nicole Burgess is a 26 y.o. [id number] at [redacted]w[redacted]d  Subjective: Inadequate pain relief on right side after epidural placement and re-dosing. No incisional pain. Feeling pressure  Objective: BP 92/66  Pulse 125  Temp(Src) 97.8 F (36.6 C) (Oral)  Resp 20  Ht 5\' 4"  (1.626 m)  Wt 97.977 kg (216 lb)  BMI 37.08 kg/m2  SpO2 97%  Breastfeeding? Unknown   Patient Vitals for the past 24 hrs:  BP Temp Temp src Pulse Resp SpO2 Height Weight  08/14/11 0637 92/66 mmHg - - 125  20  - - -  08/14/11 0633 67/40 mmHg - - 138  20  - - -  08/14/11 0628 70/28 mmHg - - 138  20  - - -  08/14/11 0623 110/56 mmHg - - 120  20  - - -  08/14/11 0602 136/51 mmHg - - 94  20  - - -            08/14/11 0557 120/44 mmHg - - 91  20  - - -            08/14/11 0552 112/59 mmHg - - 109  20  - - -            08/14/11 0547 116/56 mmHg - - 93  20  - - -            08/14/11 0542 116/48 mmHg - - 97  20  - - -            08/14/11 0538 125/56 mmHg - - 77  20  99 % - -  08/14/11 0536 130/66 mmHg - - 89  20  - - -                      08/14/11 0530 139/70 mmHg - - 109  20  - - -            08/14/11 0525 143/69 mmHg - - 82  18  - - -                      08/14/11 0505 156/57 mmHg - - 113  20  - - -    Total I/O In: -  Out: 150 [Urine:150]  FHT:  FHR: 120 bpm, variability: moderate,  accelerations:  Abscent,  decelerations:  Present few periods of mild-moderate variables, early decels. Pos scalp stim UC:   regular, every 2-3 minutes, strong SVE:   Dilation: Lip/rim Effacement (%): 90 Station: 0 Exam by:: Ivonne Andrew CNM  Labs:  Assessment / Plan: Spontaneous labor, progressing normally Post-epidural hypotension  Labor: Progressing normally Preeclampsia:  NA Fetal Wellbeing:  Category II Pain Control:  Fentanyl I/D:  n/a Anticipated MOD:  NSVD Give fluid bolus, meds necessary to correct hypotension  Nicole Burgess 08/14/2011, 6:29 AM

## 2011-08-14 NOTE — Plan of Care (Signed)
Problem: Consults Goal: Birthing Suites Patient Information Press F2 to bring up selections list  Outcome: Completed/Met Date Met:  08/14/11  Pt > [redacted] weeks EGA

## 2011-08-15 LAB — CBC
MCHC: 33.6 g/dL (ref 30.0–36.0)
Platelets: 144 10*3/uL — ABNORMAL LOW (ref 150–400)
RDW: 14.5 % (ref 11.5–15.5)

## 2011-08-15 MED ORDER — IBUPROFEN 600 MG PO TABS: 600.0000 mg | ORAL_TABLET | Freq: Four times a day (QID) | ORAL | Status: AC | PRN

## 2011-08-15 MED ORDER — RHO D IMMUNE GLOBULIN 1500 UNIT/2ML IJ SOLN
300.0000 ug | Freq: Once | INTRAMUSCULAR | Status: AC
  Administered 2011-08-15: 300 ug via INTRAMUSCULAR
  Filled 2011-08-15: qty 2

## 2011-08-15 NOTE — Progress Notes (Signed)
Post Partum Day 1  Subjective: no complaints, up ad lib, voiding, tolerating PO and + flatus  Objective: Blood pressure 106/63, pulse 82, temperature 97.6 F (36.4 C), temperature source Oral, resp. rate 18, height 5\' 4"  (1.626 m), weight 97.977 kg (216 lb), SpO2 98.00%, unknown if currently breastfeeding.  Physical Exam:  General: alert, cooperative and no distress Lochia: appropriate Uterine Fundus: firm Incision: N/A DVT Evaluation: No evidence of DVT seen on physical exam.   Basename 08/15/11 0545 08/14/11 0015  HGB 11.3* 13.3  HCT 33.6* 38.7    Assessment/Plan: Discharge home pending infant discharge   LOS: 2 days   LEFTWICH-KIRBY, LISA 08/15/2011, 7:50 AM

## 2011-08-15 NOTE — Discharge Summary (Signed)
Obstetric Discharge Summary Reason for Admission: onset of labor Prenatal Procedures: none Intrapartum Procedures: VBAC Postpartum Procedures: none Complications-Operative and Postpartum: none Hemoglobin  Date Value Range Status  08/15/2011 11.3* 12.0-15.0 (g/dL) Final     HCT  Date Value Range Status  08/15/2011 33.6* 36.0-46.0 (%) Final    Discharge Diagnoses: Term Pregnancy-delivered  Discharge Information: Date: 08/15/2011 Activity: unrestricted and pelvic rest Diet: routine Medications: Ibuprofen Condition: stable Instructions: refer to practice specific booklet Discharge to: home Follow-up Information    Follow up in 5 weeks. (F/u postpartum at [facility])          Newborn Data: Live born female  Birth Weight: 7 lb 8.3 oz (3410 g) APGAR: 7, 9  Home with mother.  LEFTWICH-KIRBY, LISA 08/15/2011, 7:56 AM

## 2011-08-15 NOTE — Plan of Care (Signed)
Problem: Phase II Progression Outcomes Goal: Rh isoimmunization per orders Outcome: Completed/Met Date Met:  08/15/11 Pt received rhogam today, baby a positive.

## 2011-08-15 NOTE — Progress Notes (Signed)
UR Chart review completed.  

## 2011-08-16 ENCOUNTER — Inpatient Hospital Stay (HOSPITAL_COMMUNITY): Admission: RE | Admit: 2011-08-16 | Payer: Medicaid Other | Source: Ambulatory Visit

## 2011-08-16 LAB — RH IG WORKUP (INCLUDES ABO/RH)
ABO/RH(D): A NEG
Gestational Age(Wks): 40.6

## 2011-08-16 NOTE — Addendum Note (Signed)
Addendum  created 08/16/11 0816 by Isabella Bowens, CRNA   Modules edited:Notes Section

## 2011-08-16 NOTE — Anesthesia Postprocedure Evaluation (Signed)
  Anesthesia Post-op Note  Patient: Nicole Burgess  Procedure(s) Performed: * No procedures listed *  Patient Location: PACU and Mother/Baby  Anesthesia Type: Epidural  Level of Consciousness: awake, alert  and oriented  Airway and Oxygen Therapy: Patient Spontanous Breathing  Post-op Pain: none  Post-op Assessment: Post-op Vital signs reviewed  Post-op Vital Signs: Reviewed and stable  Complications: No apparent anesthesia complications

## 2011-08-16 NOTE — Progress Notes (Signed)
  Post Partum Day 2  Subjective: no complaints, up ad lib, voiding, tolerating PO and + flatus  Objective: Blood pressure 101/64, pulse 69, temperature 98.2 F (36.8 C), temperature source Oral, resp. rate 20, height 5\' 4"  (1.626 m), weight 97.977 kg (216 lb), SpO2 98.00%, unknown if currently breastfeeding.  Physical Exam:  General: alert, cooperative and no distress Lochia: appropriate Uterine Fundus: firm Incision: N/A DVT Evaluation: No evidence of DVT seen on physical exam.   Basename 08/15/11 0545 08/14/11 0015  HGB 11.3* 13.3  HCT 33.6* 38.7    Assessment/Plan: Discharge home .Bottle feeding. Outpatient circumcision. Unsure about contraception method.   LOS: 3 days    D. Piloto [employer]. MD PGY-1 08/16/2011, 8:04 AM

## 2011-08-16 NOTE — Addendum Note (Signed)
Addendum  created 08/16/11 1610 by Isabella Bowens, CRNA   Modules edited:Notes Section

## 2012-01-23 ENCOUNTER — Emergency Department (HOSPITAL_COMMUNITY)
Admission: EM | Admit: 2012-01-23 | Discharge: 2012-01-23 | Disposition: A | Discharge status: Home / Self Care | Payer: Self-pay | Attending: Emergency Medicine | Admitting: Emergency Medicine

## 2012-01-23 ENCOUNTER — Encounter (HOSPITAL_COMMUNITY): Payer: Self-pay | Admitting: Emergency Medicine

## 2012-01-23 ENCOUNTER — Encounter (HOSPITAL_COMMUNITY): Payer: Self-pay | Admitting: *Deleted

## 2012-01-23 ENCOUNTER — Emergency Department (HOSPITAL_COMMUNITY)
Admission: EM | Admit: 2012-01-23 | Discharge: 2012-01-23 | Discharge status: Against Medical Advice | Payer: Self-pay | Attending: Emergency Medicine | Admitting: Emergency Medicine

## 2012-01-23 DIAGNOSIS — N390 Urinary tract infection, site not specified: Secondary | ICD-10-CM | POA: Insufficient documentation

## 2012-01-23 DIAGNOSIS — R1110 Vomiting, unspecified: Secondary | ICD-10-CM | POA: Insufficient documentation

## 2012-01-23 DIAGNOSIS — N12 Tubulo-interstitial nephritis, not specified as acute or chronic: Secondary | ICD-10-CM

## 2012-01-23 DIAGNOSIS — R509 Fever, unspecified: Secondary | ICD-10-CM | POA: Insufficient documentation

## 2012-01-23 LAB — PREGNANCY, URINE: Preg Test, Ur: NEGATIVE

## 2012-01-23 LAB — URINALYSIS, ROUTINE W REFLEX MICROSCOPIC
Glucose, UA: NEGATIVE mg/dL
pH: 5.5 (ref 5.0–8.0)

## 2012-01-23 LAB — URINE MICROSCOPIC-ADD ON

## 2012-01-23 MED ORDER — MORPHINE SULFATE 4 MG/ML IJ SOLN
2.0000 mg | Freq: Once | INTRAMUSCULAR | Status: AC
  Administered 2012-01-23: 2 mg via INTRAVENOUS
  Filled 2012-01-23: qty 1

## 2012-01-23 MED ORDER — HYDROCODONE-ACETAMINOPHEN 5-325 MG PO TABS: 1.0000 | ORAL_TABLET | ORAL | Status: AC | PRN

## 2012-01-23 MED ORDER — ONDANSETRON HCL 4 MG/2ML IJ SOLN
4.0000 mg | Freq: Once | INTRAMUSCULAR | Status: AC
  Administered 2012-01-23: 4 mg via INTRAVENOUS
  Filled 2012-01-23: qty 2

## 2012-01-23 MED ORDER — CIPROFLOXACIN IN D5W 400 MG/200ML IV SOLN
400.0000 mg | Freq: Once | INTRAVENOUS | Status: AC
  Administered 2012-01-23: 400 mg via INTRAVENOUS
  Filled 2012-01-23: qty 200

## 2012-01-23 MED ORDER — SODIUM CHLORIDE 0.9 % IV SOLN: Freq: Once | INTRAVENOUS | Status: DC

## 2012-01-23 MED ORDER — SODIUM CHLORIDE 0.9 % IV BOLUS (SEPSIS)
1000.0000 mL | INTRAVENOUS | Status: AC
  Administered 2012-01-23: 1000 mL via INTRAVENOUS

## 2012-01-23 MED ORDER — ACETAMINOPHEN 500 MG PO TABS
ORAL_TABLET | ORAL | Status: AC
  Administered 2012-01-23: 1000 mg
  Filled 2012-01-23: qty 2

## 2012-01-23 MED ORDER — KETOROLAC TROMETHAMINE 30 MG/ML IJ SOLN
30.0000 mg | Freq: Once | INTRAMUSCULAR | Status: AC
  Administered 2012-01-23: 30 mg via INTRAVENOUS
  Filled 2012-01-23: qty 1

## 2012-01-23 MED ORDER — CIPROFLOXACIN HCL 500 MG PO TABS: 500.0000 mg | ORAL_TABLET | Freq: Two times a day (BID) | ORAL | Status: AC

## 2012-01-23 NOTE — ED Notes (Signed)
Patient c/o left side/flank pain.  States has been having chills, then sweats intermittently since yesterday.  Last dose of Motrin was 2 hours ago.

## 2012-01-23 NOTE — ED Provider Notes (Signed)
History     CSN: [id number]  Arrival date & time 01/23/12  0207   First MD Initiated Contact with Patient 01/23/12 0244      Chief Complaint  Patient presents with  . Fever  . Abdominal Pain    (Consider location/radiation/quality/duration/timing/severity/associated sxs/prior treatment) HPI Nicole Burgess is a 26 y.o. female who presents to the Emergency Department complaining of  Left flank pain, chills,  Body aches, and diaphoresis that began yesterday. She has a h/o recurrent urinary tract infections. Has taken motrin with no relief.   Past Medical History  Diagnosis Date  . Interstitial cystitis   . Herpes     Past Surgical History  Procedure Date  . Cesarean section     Family History  Problem Relation Age of Onset  . Mental retardation Mother     trisomy 57  . Diabetes Paternal Grandmother     History  Substance Use Topics  . Smoking status: Never Smoker   . Smokeless tobacco: Never Used  . Alcohol Use: No    OB History    Grav Para Term Preterm Abortions TAB SAB Ect Mult Living   4 3 3  1  1   3       Review of Systems  Constitutional: Positive for fever, chills, diaphoresis and fatigue.       10 Systems reviewed and are negative for acute change except as noted in the HPI.  HENT: Negative for congestion.   Eyes: Negative for discharge and redness.  Respiratory: Negative for cough and shortness of breath.   Cardiovascular: Negative for chest pain.  Gastrointestinal: Negative for vomiting and abdominal pain.  Genitourinary: Positive for flank pain.  Musculoskeletal: Negative for back pain.  Skin: Negative for rash.  Neurological: Negative for syncope, numbness and headaches.  Psychiatric/Behavioral:       No behavior change.    Allergies  Review of patient's allergies indicates no known allergies.  Home Medications   Current Outpatient Rx  Name Route Sig Dispense Refill  . PRENATAL PLUS 27-1 MG PO TABS Oral Take 1 tablet by mouth daily.         BP 106/50  Pulse 94  Temp 99.1 F (37.3 C) (Oral)  Resp 20  Ht 5\' 4"  (1.626 m)  Wt 200 lb (90.719 kg)  BMI 34.33 kg/m2  SpO2 97%  LMP 12/23/2011  Physical Exam  Nursing note and vitals reviewed. Constitutional:       Awake, alert, nontoxic appearance.  HENT:  Head: Atraumatic.  Eyes: Right eye exhibits no discharge. Left eye exhibits no discharge.  Neck: Neck supple.  Cardiovascular: Normal heart sounds.   Pulmonary/Chest: Effort normal and breath sounds normal. She exhibits no tenderness.  Abdominal: Soft. There is no tenderness. There is no rebound.  Genitourinary:       Left cva tenderness  Musculoskeletal: She exhibits no tenderness.       Baseline ROM, no obvious new focal weakness.  Neurological:       Mental status and motor strength appears baseline for patient and situation.  Skin: No rash noted.  Psychiatric: She has a normal mood and affect.    ED Course  Procedures (including critical care time) Results for orders placed during the hospital encounter of 01/23/12  URINALYSIS, ROUTINE W REFLEX MICROSCOPIC      Component Value Range   Color, Urine YELLOW  YELLOW   APPearance HAZY (*) CLEAR   Specific Gravity, Urine 1.010  1.005 - 1.030   pH  5.5  5.0 - 8.0   Glucose, UA NEGATIVE  NEGATIVE mg/dL   Hgb urine dipstick SMALL (*) NEGATIVE   Bilirubin Urine NEGATIVE  NEGATIVE   Ketones, ur NEGATIVE  NEGATIVE mg/dL   Protein, ur TRACE (*) NEGATIVE mg/dL   Urobilinogen, UA 1.0  0.0 - 1.0 mg/dL   Nitrite NEGATIVE  NEGATIVE   Leukocytes, UA SMALL (*) NEGATIVE  PREGNANCY, URINE      Component Value Range   Preg Test, Ur NEGATIVE  NEGATIVE  URINE MICROSCOPIC-ADD ON      Component Value Range   Squamous Epithelial / LPF RARE  RARE   WBC, UA TOO NUMEROUS TO COUNT  <3 WBC/hpf   RBC / HPF 0-2  <3 RBC/hpf   Bacteria, UA MANY (*) RARE       MDM  Patient presents with flank pain, chills, body aches since yesterday. UA positive for infection. Initiated  antibiotic treatment. Given IVF, antiinflammatory, analgesic, antiemetic. Patient feels better. Has taken PO fluids.  Pt feels improved after observation and/or treatment in ED.Pt stable in ED with no significant deterioration in condition.The patient appears reasonably screened and/or stabilized for discharge and I doubt any other medical condition or other [facility] requiring further screening, evaluation, or treatment in the ED at this time prior to discharge.  MDM Reviewed: nursing note and vitals Interpretation: labs           Nicoletta Dress. Colon Branch, MD 01/23/12 [phone]

## 2012-01-23 NOTE — ED Notes (Signed)
Dx with kidney infection yesterday, given antibiotics, but now vomiting.

## 2012-01-26 LAB — URINE CULTURE

## 2012-01-27 NOTE — ED Notes (Signed)
+   urine Patient treated with Cipro-sensitive to same-chart appended per protocol MD. 

## 2014-05-09 ENCOUNTER — Encounter (HOSPITAL_COMMUNITY): Payer: Self-pay | Admitting: *Deleted

## 2014-08-12 ENCOUNTER — Other Ambulatory Visit: Payer: Self-pay | Admitting: Obstetrics & Gynecology

## 2014-08-12 ENCOUNTER — Encounter: Payer: Self-pay | Admitting: Obstetrics & Gynecology

## 2014-09-15 ENCOUNTER — Encounter (HOSPITAL_COMMUNITY): Payer: Self-pay | Admitting: Emergency Medicine

## 2014-09-15 ENCOUNTER — Emergency Department (HOSPITAL_COMMUNITY)
Admission: EM | Admit: 2014-09-15 | Discharge: 2014-09-16 | Disposition: A | Discharge status: Home / Self Care | Attending: Emergency Medicine | Admitting: Emergency Medicine

## 2014-09-15 DIAGNOSIS — Z3202 Encounter for pregnancy test, result negative: Secondary | ICD-10-CM | POA: Insufficient documentation

## 2014-09-15 DIAGNOSIS — Z8619 Personal history of other infectious and parasitic diseases: Secondary | ICD-10-CM | POA: Insufficient documentation

## 2014-09-15 DIAGNOSIS — R000 Tachycardia, unspecified: Secondary | ICD-10-CM | POA: Insufficient documentation

## 2014-09-15 DIAGNOSIS — N390 Urinary tract infection, site not specified: Secondary | ICD-10-CM | POA: Insufficient documentation

## 2014-09-15 DIAGNOSIS — Z79899 Other long term (current) drug therapy: Secondary | ICD-10-CM | POA: Insufficient documentation

## 2014-09-15 DIAGNOSIS — R509 Fever, unspecified: Secondary | ICD-10-CM

## 2014-09-15 MED ORDER — ONDANSETRON 4 MG PO TBDP
4.0000 mg | ORAL_TABLET | Freq: Once | ORAL | Status: AC
  Administered 2014-09-15: 4 mg via ORAL
  Filled 2014-09-15: qty 1

## 2014-09-15 MED ORDER — ACETAMINOPHEN 500 MG PO TABS
1000.0000 mg | ORAL_TABLET | Freq: Once | ORAL | Status: AC
  Administered 2014-09-15: 1000 mg via ORAL
  Filled 2014-09-15: qty 2

## 2014-09-15 MED ORDER — SODIUM CHLORIDE 0.9 % IV SOLN
INTRAVENOUS | Status: DC
  Administered 2014-09-15: via INTRAVENOUS

## 2014-09-15 NOTE — ED Provider Notes (Signed)
CSN: [id number]     Arrival date & time 09/15/14  2218 History   First MD Initiated Contact with Patient 09/15/14 2324     Chief Complaint  Patient presents with  . Headache     (Consider location/radiation/quality/duration/timing/severity/associated sxs/prior Treatment) The history is provided by the patient.   Telitha Linus GalasBrianna Schobert is a 29 y.o. [id number] who presents to the ED with fever and back pain that started 3 days ago. The symptoms have gotten progressively worse with fever up to 102.5 tonight. Tonight she has a headache with the fever. She complains of hurting all over and just feeling bad.   Past Medical History  Diagnosis Date  . Interstitial cystitis   . Herpes    Past Surgical History  Procedure Laterality Date  . Cesarean section     Family History  Problem Relation Age of Onset  . Mental retardation Mother     trisomy 3218  . Diabetes Paternal Grandmother    History  Substance Use Topics  . Smoking status: Never Smoker   . Smokeless tobacco: Never Used  . Alcohol Use: No   OB History    Gravida Para Term Preterm AB TAB SAB Ectopic Multiple Living   4 3 3  1  1   3      Review of Systems  Constitutional: Positive for fever and chills.  Eyes: Negative for pain, redness and itching.  Respiratory: Negative for cough and shortness of breath.   Cardiovascular: Negative for chest pain.  Gastrointestinal: Positive for nausea. Negative for vomiting and abdominal pain.  Genitourinary: Negative for dysuria and urgency.  Musculoskeletal: Positive for back pain.  Skin: Negative for rash.  Neurological: Positive for headaches.  Psychiatric/Behavioral: Negative for confusion. The patient is not nervous/anxious.       Allergies  Review of patient's allergies indicates no known allergies.  Home Medications   Prior to Admission medications   Medication Sig Start Date End Date Taking? Authorizing Provider  cephALEXin (KEFLEX) 500 MG capsule Take 1 capsule (500 mg  total) by mouth 4 (four) times daily. 09/16/14   Rasheedah Reis Orlene OchM Travor Royce, NP  HYDROcodone-acetaminophen (NORCO/VICODIN) 5-325 MG per tablet Take 1 tablet by mouth every 4 (four) hours as needed. 09/16/14   Coraleigh Sheeran Orlene OchM Carliyah Cotterman, NP  prenatal vitamin w/FE, FA (PRENATAL 1 + 1) 27-1 MG TABS Take 1 tablet by mouth daily.      Historical Provider, MD   BP 144/63 mmHg  Pulse 110  Temp(Src) 98.9 F (37.2 C) (Oral)  Resp 18  Ht 5\' 5"  (1.651 m)  Wt 200 lb (90.719 kg)  BMI 33.28 kg/m2  SpO2 100% Physical Exam  Constitutional: She is oriented to person, place, and time. She appears well-developed and well-nourished. No distress.  HENT:  Head: Normocephalic.  Eyes: EOM are normal.  Neck: Neck supple.  Cardiovascular: Tachycardia present.   Pulmonary/Chest: Effort normal.  Abdominal: Soft. Bowel sounds are normal. There is no tenderness. There is no CVA tenderness.  Musculoskeletal: Normal range of motion.  Neurological: She is alert and oriented to person, place, and time. No cranial nerve deficit.  Skin: Skin is warm and dry.  Psychiatric: She has a normal mood and affect. Her behavior is normal.  Nursing note and vitals reviewed.   ED Course  Procedures (including critical care time) IV hydration, Zofran 4 mg IV for nausea, labs, Toradol for headache, Rocephin 1 gram IV for UTI,  tylenol for fever.  Patient re evaluated after medications and fluids. She is  feeling much better. She has no nausea, no back pain and only a slight headache.   Labs Review Results for orders placed or performed during the hospital encounter of 09/15/14 (from the past 24 hour(s))  Urinalysis, Routine w reflex microscopic     Status: Abnormal   Collection Time: 09/15/14 11:15 PM  Result Value Ref Range   Color, Urine YELLOW YELLOW   APPearance CLEAR CLEAR   Specific Gravity, Urine 1.010 1.005 - 1.030   pH 6.5 5.0 - 8.0   Glucose, UA NEGATIVE NEGATIVE mg/dL   Hgb urine dipstick SMALL (A) NEGATIVE   Bilirubin Urine NEGATIVE  NEGATIVE   Ketones, ur NEGATIVE NEGATIVE mg/dL   Protein, ur NEGATIVE NEGATIVE mg/dL   Urobilinogen, UA 0.2 0.0 - 1.0 mg/dL   Nitrite POSITIVE (A) NEGATIVE   Leukocytes, UA NEGATIVE NEGATIVE  Pregnancy, urine     Status: None   Collection Time: 09/15/14 11:15 PM  Result Value Ref Range   Preg Test, Ur NEGATIVE NEGATIVE  Urine microscopic-add on     Status: Abnormal   Collection Time: 09/15/14 11:15 PM  Result Value Ref Range   Squamous Epithelial / LPF MANY (A) RARE   WBC, UA 0-2 <3 WBC/hpf   RBC / HPF 0-2 <3 RBC/hpf   Bacteria, UA MANY (A) RARE  CBC with Differential     Status: Abnormal   Collection Time: 09/15/14 11:55 PM  Result Value Ref Range   WBC 10.1 4.0 - 10.5 K/uL   RBC 4.08 3.87 - 5.11 MIL/uL   Hemoglobin 13.4 12.0 - 15.0 g/dL   HCT 40.9 81.1 - 91.4 %   MCV 96.1 78.0 - 100.0 fL   MCH 32.8 26.0 - 34.0 pg   MCHC 34.2 30.0 - 36.0 g/dL   RDW 78.2 95.6 - 21.3 %   Platelets 185 150 - 400 K/uL   Neutrophils Relative % 69 43 - 77 %   Neutro Abs 6.9 1.7 - 7.7 K/uL   Lymphocytes Relative 19 12 - 46 %   Lymphs Abs 1.9 0.7 - 4.0 K/uL   Monocytes Relative 12 3 - 12 %   Monocytes Absolute 1.2 (H) 0.1 - 1.0 K/uL   Eosinophils Relative 0 0 - 5 %   Eosinophils Absolute 0.0 0.0 - 0.7 K/uL   Basophils Relative 0 0 - 1 %   Basophils Absolute 0.0 0.0 - 0.1 K/uL  Comprehensive metabolic panel     Status: Abnormal   Collection Time: 09/15/14 11:55 PM  Result Value Ref Range   Sodium 136 135 - 145 mmol/L   Potassium 3.7 3.5 - 5.1 mmol/L   Chloride 106 96 - 112 mmol/L   CO2 24 19 - 32 mmol/L   Glucose, Bld 112 (H) 70 - 99 mg/dL   BUN 11 6 - 23 mg/dL   Creatinine, Ser 0.86 0.50 - 1.10 mg/dL   Calcium 9.1 8.4 - 57.8 mg/dL   Total Protein 7.7 6.0 - 8.3 g/dL   Albumin 4.2 3.5 - 5.2 g/dL   AST 27 0 - 37 U/L   ALT 20 0 - 35 U/L   Alkaline Phosphatase 86 39 - 117 U/L   Total Bilirubin 0.9 0.3 - 1.2 mg/dL   GFR calc non Af Amer 87 (L) >90 mL/min   GFR calc Af Amer >90 >90 mL/min    Anion gap 6 5 - 15    MDM  29 y.o. female with back pain, fever and headache that has gotten progressively worse over  the past 3 days. Today fever up to 102.5 and headache. Stable for d/c without meningeal signs and marked improvement after medications and IV fluids. She will return if symptoms worsen. Will continue treatment for UTI.  Final diagnoses:  UTI (lower urinary tract infection)  Fever, unspecified fever cause      [facility], NP 09/16/14 0110  Geoffery Lyons, MD 09/16/14 [phone]

## 2014-09-15 NOTE — ED Notes (Signed)
Pt c/o headache, fever, and back pain for a few days.

## 2014-09-16 LAB — COMPREHENSIVE METABOLIC PANEL
ALBUMIN: 4.2 g/dL (ref 3.5–5.2)
ALK PHOS: 86 U/L (ref 39–117)
ALT: 20 U/L (ref 0–35)
ANION GAP: 6 (ref 5–15)
AST: 27 U/L (ref 0–37)
BILIRUBIN TOTAL: 0.9 mg/dL (ref 0.3–1.2)
BUN: 11 mg/dL (ref 6–23)
CHLORIDE: 106 mmol/L (ref 96–112)
CO2: 24 mmol/L (ref 19–32)
Calcium: 9.1 mg/dL (ref 8.4–10.5)
Creatinine, Ser: 0.9 mg/dL (ref 0.50–1.10)
GFR calc Af Amer: >90 mL/min (ref >90)
GFR calc non Af Amer: 87 mL/min — ABNORMAL LOW (ref >90)
Glucose, Bld: 112 mg/dL — ABNORMAL HIGH (ref 70–99)
POTASSIUM: 3.7 mmol/L (ref 3.5–5.1)
Sodium: 136 mmol/L (ref 135–145)
Total Protein: 7.7 g/dL (ref 6.0–8.3)

## 2014-09-16 LAB — URINALYSIS, ROUTINE W REFLEX MICROSCOPIC
BILIRUBIN URINE: NEGATIVE
GLUCOSE, UA: NEGATIVE mg/dL
Ketones, ur: NEGATIVE mg/dL
Leukocytes, UA: NEGATIVE
Nitrite: POSITIVE — AB
PH: 6.5 (ref 5.0–8.0)
Protein, ur: NEGATIVE mg/dL
SPECIFIC GRAVITY, URINE: 1.01 (ref 1.005–1.030)
Urobilinogen, UA: 0.2 mg/dL (ref 0.0–1.0)

## 2014-09-16 LAB — URINE MICROSCOPIC-ADD ON

## 2014-09-16 LAB — CBC WITH DIFFERENTIAL/PLATELET
BASOS ABS: 0 10*3/uL (ref 0.0–0.1)
Basophils Relative: 0 % (ref 0–1)
EOS ABS: 0 10*3/uL (ref 0.0–0.7)
Eosinophils Relative: 0 % (ref 0–5)
HEMATOCRIT: 39.2 % (ref 36.0–46.0)
HEMOGLOBIN: 13.4 g/dL (ref 12.0–15.0)
LYMPHS ABS: 1.9 10*3/uL (ref 0.7–4.0)
LYMPHS PCT: 19 % (ref 12–46)
MCH: 32.8 pg (ref 26.0–34.0)
MCHC: 34.2 g/dL (ref 30.0–36.0)
MCV: 96.1 fL (ref 78.0–100.0)
Monocytes Absolute: 1.2 10*3/uL — ABNORMAL HIGH (ref 0.1–1.0)
Monocytes Relative: 12 % (ref 3–12)
Neutro Abs: 6.9 10*3/uL (ref 1.7–7.7)
Neutrophils Relative %: 69 % (ref 43–77)
Platelets: 185 10*3/uL (ref 150–400)
RBC: 4.08 MIL/uL (ref 3.87–5.11)
RDW: 12.9 % (ref 11.5–15.5)
WBC: 10.1 10*3/uL (ref 4.0–10.5)

## 2014-09-16 LAB — PREGNANCY, URINE: Preg Test, Ur: NEGATIVE

## 2014-09-16 MED ORDER — CEPHALEXIN 500 MG PO CAPS
500.0000 mg | ORAL_CAPSULE | Freq: Four times a day (QID) | ORAL | Status: DC
Start: 2014-09-16 — End: 2015-03-31

## 2014-09-16 MED ORDER — HYDROCODONE-ACETAMINOPHEN 5-325 MG PO TABS: 1.0000 | ORAL_TABLET | ORAL | Status: DC | PRN

## 2014-09-16 MED ORDER — KETOROLAC TROMETHAMINE 30 MG/ML IJ SOLN
30.0000 mg | Freq: Once | INTRAMUSCULAR | Status: AC
  Administered 2014-09-16: 30 mg via INTRAVENOUS
  Filled 2014-09-16: qty 1

## 2014-09-16 MED ORDER — DEXTROSE 5 % IV SOLN
1.0000 g | Freq: Once | INTRAVENOUS | Status: AC
  Administered 2014-09-16: 1 g via INTRAVENOUS
  Filled 2014-09-16: qty 10

## 2014-09-16 NOTE — Discharge Instructions (Signed)
We are treating your urinary tract infection, fever and headache. If your symptoms worsen, you have persistent vomiting, high fever that does not come down with tylenol and ibuprofen or your headache worsens or you have neck pain return for re check.

## 2014-09-18 LAB — URINE CULTURE

## 2014-09-20 ENCOUNTER — Telehealth (HOSPITAL_BASED_OUTPATIENT_CLINIC_OR_DEPARTMENT_OTHER): Payer: Self-pay | Admitting: Emergency Medicine

## 2014-09-20 NOTE — Telephone Encounter (Signed)
Post ED Visit - Positive Culture Follow-up  Culture report reviewed by antimicrobial stewardship pharmacist: []  Wes Dulaney, Pharm.D., BCPS [x]  Celedonio MiyamotoJeremy Frens, Pharm.D., BCPS []  Georgina PillionElizabeth Martin, [address] BCPS []  SoldierMinh Pham, [place]arm[place]D., BCPS, AAHIVP []  Estella HuskMichelle Turner, Pharm.D., BCPS, AAHIVP []  Elder CyphersLorie Poole, [address]Ph[address]D., BCPS  Positive urine culture E. Coli Treated with cephalexin, organism sensitive to the same and no further patient follow-up is required at this time.  Berle MullMiller, Colyn Miron 09/20/2014, 12:22 PM

## 2015-03-31 ENCOUNTER — Encounter: Payer: Self-pay | Admitting: Emergency Medicine

## 2015-03-31 ENCOUNTER — Emergency Department
Admission: EM | Admit: 2015-03-31 | Discharge: 2015-03-31 | Disposition: A | Discharge status: Home / Self Care | Payer: Self-pay | Attending: Emergency Medicine | Admitting: Emergency Medicine

## 2015-03-31 DIAGNOSIS — N390 Urinary tract infection, site not specified: Secondary | ICD-10-CM | POA: Insufficient documentation

## 2015-03-31 DIAGNOSIS — Z3202 Encounter for pregnancy test, result negative: Secondary | ICD-10-CM | POA: Insufficient documentation

## 2015-03-31 DIAGNOSIS — Z79899 Other long term (current) drug therapy: Secondary | ICD-10-CM | POA: Insufficient documentation

## 2015-03-31 DIAGNOSIS — R42 Dizziness and giddiness: Secondary | ICD-10-CM

## 2015-03-31 LAB — COMPREHENSIVE METABOLIC PANEL
ALBUMIN: 4.3 g/dL (ref 3.5–5.0)
ALT: 12 U/L — ABNORMAL LOW (ref 14–54)
ANION GAP: 5 (ref 5–15)
AST: 24 U/L (ref 15–41)
Alkaline Phosphatase: 89 U/L (ref 38–126)
BILIRUBIN TOTAL: 0.7 mg/dL (ref 0.3–1.2)
BUN: 13 mg/dL (ref 6–20)
CALCIUM: 9.3 mg/dL (ref 8.9–10.3)
CO2: 28 mmol/L (ref 22–32)
Chloride: 106 mmol/L (ref 101–111)
Creatinine, Ser: 0.89 mg/dL (ref 0.44–1.00)
GFR calc non Af Amer: >60 mL/min (ref >60)
GLUCOSE: 144 mg/dL — AB (ref 65–99)
POTASSIUM: 3.3 mmol/L — AB (ref 3.5–5.1)
SODIUM: 139 mmol/L (ref 135–145)
TOTAL PROTEIN: 7.8 g/dL (ref 6.5–8.1)

## 2015-03-31 LAB — CBC WITH DIFFERENTIAL/PLATELET
BASOS ABS: 0 10*3/uL (ref 0–0.1)
BASOS PCT: 0 %
Eosinophils Absolute: 0.1 10*3/uL (ref 0–0.7)
Eosinophils Relative: 1 %
HEMATOCRIT: 42.2 % (ref 35.0–47.0)
HEMOGLOBIN: 14.3 g/dL (ref 12.0–16.0)
LYMPHS PCT: 31 %
Lymphs Abs: 3.5 10*3/uL (ref 1.0–3.6)
MCH: 32.5 pg (ref 26.0–34.0)
MCHC: 33.8 g/dL (ref 32.0–36.0)
MCV: 96 fL (ref 80.0–100.0)
MONO ABS: 0.6 10*3/uL (ref 0.2–0.9)
Monocytes Relative: 6 %
NEUTROS ABS: 6.9 10*3/uL — AB (ref 1.4–6.5)
NEUTROS PCT: 62 %
Platelets: 204 10*3/uL (ref 150–440)
RBC: 4.4 MIL/uL (ref 3.80–5.20)
RDW: 13.3 % (ref 11.5–14.5)
WBC: 11.1 10*3/uL — AB (ref 3.6–11.0)

## 2015-03-31 LAB — URINALYSIS COMPLETE WITH MICROSCOPIC (ARMC ONLY)
BACTERIA UA: NONE SEEN
Bilirubin Urine: NEGATIVE
Glucose, UA: NEGATIVE mg/dL
KETONES UR: NEGATIVE mg/dL
NITRITE: POSITIVE — AB
PROTEIN: NEGATIVE mg/dL
SPECIFIC GRAVITY, URINE: 1.021 (ref 1.005–1.030)
pH: 6 (ref 5.0–8.0)

## 2015-03-31 LAB — LIPASE, BLOOD: Lipase: 26 U/L (ref 22–51)

## 2015-03-31 LAB — PREGNANCY, URINE: PREG TEST UR: NEGATIVE

## 2015-03-31 MED ORDER — MECLIZINE HCL 25 MG PO TABS: 25.0000 mg | ORAL_TABLET | Freq: Three times a day (TID) | ORAL | Status: DC | PRN

## 2015-03-31 MED ORDER — DIPHENHYDRAMINE HCL 50 MG/ML IJ SOLN
25.0000 mg | Freq: Once | INTRAMUSCULAR | Status: AC
  Administered 2015-03-31: 25 mg via INTRAVENOUS
  Filled 2015-03-31: qty 1

## 2015-03-31 MED ORDER — DEXTROSE 5 % IV SOLN
1.0000 g | Freq: Once | INTRAVENOUS | Status: AC
  Administered 2015-03-31: 1 g via INTRAVENOUS
  Filled 2015-03-31: qty 10

## 2015-03-31 MED ORDER — CEPHALEXIN 500 MG PO CAPS: 500.0000 mg | ORAL_CAPSULE | Freq: Four times a day (QID) | ORAL | Status: DC

## 2015-03-31 MED ORDER — ACETAMINOPHEN 500 MG PO TABS
1000.0000 mg | ORAL_TABLET | Freq: Once | ORAL | Status: AC
  Administered 2015-03-31: 1000 mg via ORAL
  Filled 2015-03-31: qty 2

## 2015-03-31 MED ORDER — SODIUM CHLORIDE 0.9 % IV BOLUS (SEPSIS)
1000.0000 mL | Freq: Once | INTRAVENOUS | Status: AC
  Administered 2015-03-31: 1000 mL via INTRAVENOUS

## 2015-03-31 MED ORDER — MECLIZINE HCL 25 MG PO TABS
25.0000 mg | ORAL_TABLET | Freq: Once | ORAL | Status: AC
  Administered 2015-03-31: 25 mg via ORAL
  Filled 2015-03-31: qty 1

## 2015-03-31 MED ORDER — METOCLOPRAMIDE HCL 5 MG/ML IJ SOLN
10.0000 mg | Freq: Once | INTRAMUSCULAR | Status: AC
  Administered 2015-03-31: 10 mg via INTRAVENOUS
  Filled 2015-03-31: qty 2

## 2015-03-31 NOTE — ED Notes (Signed)
Reports dizziness with movement off and on today, n/v x 2.  Also reports LLQ pain x 2 hours.

## 2015-03-31 NOTE — ED Provider Notes (Signed)
CSN: [id number]     Arrival date & time 03/31/15  1708 History   First MD Initiated Contact with Patient 03/31/15 2125     Chief Complaint  Patient presents with  . Dizziness     (Consider location/radiation/quality/duration/timing/severity/associated sxs/prior Treatment) The history is provided by the patient.  Nicole Burgess is a 29 y.o. female history of interstitial cystitis, here presenting with dizziness. Dizziness started when she woke up today. It's worse when she stands up and moves her head. Associated with some nausea and vomiting. Denies any weakness or trouble speaking. She also has some lower abdominal pain today that reminded her of her interstitial cystitis. She states that she is always has some dysuria but not worsening usual. Denies any vomiting or fevers.    Past Medical History  Diagnosis Date  . Interstitial cystitis   . Herpes    Past Surgical History  Procedure Laterality Date  . Cesarean section     Family History  Problem Relation Age of Onset  . Mental retardation Mother     trisomy 69  . Diabetes Paternal Grandmother    Social History  Substance Use Topics  . Smoking status: Never Smoker   . Smokeless tobacco: Never Used  . Alcohol Use: No   OB History    Gravida Para Term Preterm AB TAB SAB Ectopic Multiple Living   Review of Systems  Gastrointestinal: Positive for abdominal pain.  Neurological: Positive for dizziness.  All other systems reviewed and are negative.     Allergies  Review of patient's allergies indicates no known allergies.  Home Medications   Prior to Admission medications   Medication Sig Start Date End Date Taking? Authorizing Provider  cephALEXin (KEFLEX) 500 MG capsule Take 1 capsule (500 mg total) by mouth 4 (four) times daily. 03/31/15   Richardean Canal, MD  HYDROcodone-acetaminophen (NORCO/VICODIN) 5-325 MG per tablet Take 1 tablet by mouth every 4 (four) hours as needed. 09/16/14   Hope Orlene Och, NP  meclizine (ANTIVERT) 25 MG tablet Take 1 tablet (25 mg total) by mouth 3 (three) times daily as needed for dizziness. 03/31/15   Richardean Canal, MD  prenatal vitamin w/FE, FA (PRENATAL 1 + 1) 27-1 MG TABS Take 1 tablet by mouth daily.      Historical Provider, MD   BP 110/53 mmHg  Pulse 82  Temp(Src) 98 F (36.7 C) (Oral)  Resp 16  Ht  (1.651 m)  Wt 235 lb (106.595 kg)  BMI 39.11 kg/m2  SpO2 100% Physical Exam  Constitutional: She is oriented to person, place, and time. She appears well-developed and well-nourished.  HENT:  Head: Normocephalic.  Right Ear: External ear normal.  Left Ear: External ear normal.  Mouth/Throat: Oropharynx is clear and moist.  Eyes: Conjunctivae and EOM are normal. Pupils are equal, round, and reactive to light.  ? L nystagmus   Neck: Normal range of motion. Neck supple.  Cardiovascular: Normal rate, regular rhythm and normal heart sounds.   Pulmonary/Chest: Effort normal and breath sounds normal. No respiratory distress. She has no wheezes. She has no rales.  Abdominal: Soft. Bowel sounds are normal. She exhibits no distension. There is no tenderness. There is no rebound.  Mild suprapubic tenderness   Musculoskeletal: Normal range of motion.  Neurological: She is alert and oriented to person, place, and time. No cranial nerve deficit. Coordination normal.  CN 2-12 intact.  Nl strength throughout. Nl finger to nose. Nl gait. No pronator drift   Skin: Skin is warm and dry.  Psychiatric: She has a normal mood and affect. Her behavior is normal. Judgment and thought content normal.  Nursing note and vitals reviewed.   ED Course  Procedures (including critical care time) Labs Review Labs Reviewed  COMPREHENSIVE METABOLIC PANEL - Abnormal; Notable for the following:    Potassium 3.3 (*)    Glucose, Bld 144 (*)    ALT 12 (*)    All other components within normal limits  CBC WITH DIFFERENTIAL/PLATELET - Abnormal; Notable for the following:     WBC 11.1 (*)    Neutro Abs 6.9 (*)    All other components within normal limits  URINALYSIS COMPLETEWITH MICROSCOPIC ([facility] ONLY) - Abnormal; Notable for the following:    Color, Urine YELLOW (*)    APPearance HAZY (*)    Hgb urine dipstick 1+ (*)    Nitrite POSITIVE (*)    Leukocytes, UA 1+ (*)    Squamous Epithelial / LPF 6-30 (*)    All other components within normal limits  LIPASE, BLOOD  PREGNANCY, URINE    Imaging Review No results found. I have personally reviewed and evaluated these images and lab results as part of my medical decision-making.   EKG Interpretation None      MDM   Final diagnoses:  Vertigo  UTI (lower urinary tract infection)    Nicole Burgess is a 29 y.o. female here with dizziness, ab pain. Dizziness likely peripheral vertigo. No concerning signs of stroke or dissection. Given meclizine and felt better. No need for imaging. Ab pain likely from interstitial cystitis. UA + UTI, no signs of pyelo and WBC 11. Given ceftriaxone and will dc home with keflex.     Richardean Canal, MD 03/31/15 [phone]

## 2015-03-31 NOTE — ED Notes (Signed)
Attempt for labs x 2 unsuccessful.

## 2015-03-31 NOTE — Discharge Instructions (Signed)
Stay hydrated.   Take meclizine for dizziness.  Take keflex for UTI.   See your doctor.   Return to ER if you have worse dizziness, vomiting, weakness.   Benign Positional Vertigo Vertigo means you feel like you or your surroundings are moving when they are not. Benign positional vertigo is the most common form of vertigo. Benign means that the cause of your condition is not serious. Benign positional vertigo is more common in older adults. CAUSES  Benign positional vertigo is the result of an upset in the labyrinth system. This is an area in the middle ear that helps control your balance. This may be caused by a viral infection, head injury, or repetitive motion. However, often no specific cause is found. SYMPTOMS  Symptoms of benign positional vertigo occur when you move your head or eyes in different directions. Some of the symptoms may include:  Loss of balance and falls.  Vomiting.  Blurred vision.  Dizziness.  Nausea.  Involuntary eye movements (nystagmus). DIAGNOSIS  Benign positional vertigo is usually diagnosed by physical exam. If the specific cause of your benign positional vertigo is unknown, your caregiver may perform imaging tests, such as magnetic resonance imaging (MRI) or computed tomography (CT). TREATMENT  Your caregiver may recommend movements or procedures to correct the benign positional vertigo. Medicines such as meclizine, benzodiazepines, and medicines for nausea may be used to treat your symptoms. In rare cases, if your symptoms are caused by certain conditions that affect the inner ear, you may need surgery. HOME CARE INSTRUCTIONS   Follow your caregiver's instructions.  Move slowly. Do not make sudden body or head movements.  Avoid driving.  Avoid operating heavy machinery.  Avoid performing any tasks that would be dangerous to you or others during a vertigo episode.  Drink enough fluids to keep your urine clear or pale yellow. SEEK IMMEDIATE  MEDICAL CARE IF:   You develop problems with walking, weakness, numbness, or using your arms, hands, or legs.  You have difficulty speaking.  You develop severe headaches.  Your nausea or vomiting continues or gets worse.  You develop visual changes.  Your family or friends notice any behavioral changes.  Your condition gets worse.  You have a fever.  You develop a stiff neck or sensitivity to light. MAKE SURE YOU:   Understand these instructions.  Will watch your condition.  Will get help right away if you are not doing well or get worse. Document Released: 04/01/2006 Document Revised: 09/16/2011 Document Reviewed: 03/14/2011 [facility] Patient Information 2015 [place], [place]. This information is not intended to replace advice given to you by your health care provider. Make sure you discuss any questions you have with your health care provider.

## 2016-07-08 HISTORY — PX: CYSTOSCOPY: SUR368

## 2017-02-06 DIAGNOSIS — N3020 Other chronic cystitis without hematuria: Secondary | ICD-10-CM | POA: Insufficient documentation

## 2017-02-06 DIAGNOSIS — R339 Retention of urine, unspecified: Secondary | ICD-10-CM | POA: Insufficient documentation

## 2017-02-06 DIAGNOSIS — N23 Unspecified renal colic: Secondary | ICD-10-CM | POA: Insufficient documentation

## 2017-02-12 DIAGNOSIS — N35021 Urethral stricture due to childbirth: Secondary | ICD-10-CM | POA: Insufficient documentation

## 2017-05-26 ENCOUNTER — Emergency Department (HOSPITAL_COMMUNITY)
Admission: EM | Admit: 2017-05-26 | Discharge: 2017-05-26 | Disposition: A | Discharge status: Home / Self Care | Payer: Self-pay | Attending: Emergency Medicine | Admitting: Emergency Medicine

## 2017-05-26 ENCOUNTER — Other Ambulatory Visit: Payer: Self-pay

## 2017-05-26 ENCOUNTER — Encounter (HOSPITAL_COMMUNITY): Payer: Self-pay | Admitting: *Deleted

## 2017-05-26 DIAGNOSIS — T7840XA Allergy, unspecified, initial encounter: Secondary | ICD-10-CM | POA: Insufficient documentation

## 2017-05-26 DIAGNOSIS — R600 Localized edema: Secondary | ICD-10-CM | POA: Insufficient documentation

## 2017-05-26 DIAGNOSIS — R202 Paresthesia of skin: Secondary | ICD-10-CM | POA: Insufficient documentation

## 2017-05-26 DIAGNOSIS — R21 Rash and other nonspecific skin eruption: Secondary | ICD-10-CM | POA: Insufficient documentation

## 2017-05-26 DIAGNOSIS — Z79899 Other long term (current) drug therapy: Secondary | ICD-10-CM | POA: Insufficient documentation

## 2017-05-26 MED ORDER — FAMOTIDINE 20 MG PO TABS: 20.0000 mg | ORAL_TABLET | Freq: Two times a day (BID) | ORAL | 0 refills | Status: DC

## 2017-05-26 MED ORDER — PREDNISONE 10 MG PO TABS: 50.0000 mg | ORAL_TABLET | Freq: Every day | ORAL | 0 refills | Status: AC

## 2017-05-26 MED ORDER — FAMOTIDINE IN NACL 20-0.9 MG/50ML-% IV SOLN
20.0000 mg | Freq: Once | INTRAVENOUS | Status: AC
  Administered 2017-05-26: 20 mg via INTRAVENOUS
  Filled 2017-05-26: qty 50

## 2017-05-26 MED ORDER — METHYLPREDNISOLONE SODIUM SUCC 125 MG IJ SOLR
125.0000 mg | Freq: Once | INTRAMUSCULAR | Status: AC
  Administered 2017-05-26: 125 mg via INTRAVENOUS
  Filled 2017-05-26: qty 2

## 2017-05-26 MED ORDER — DIPHENHYDRAMINE HCL 25 MG PO CAPS: 50.0000 mg | ORAL_CAPSULE | Freq: Four times a day (QID) | ORAL | 0 refills | Status: DC | PRN

## 2017-05-26 MED ORDER — DIPHENHYDRAMINE HCL 50 MG/ML IJ SOLN
25.0000 mg | Freq: Once | INTRAMUSCULAR | Status: AC
  Administered 2017-05-26: 25 mg via INTRAVENOUS
  Filled 2017-05-26: qty 1

## 2017-05-26 NOTE — Discharge Instructions (Signed)
You have an allergic reaction. Please continue to take steroids, benadryl and pepcid as prescribed.  Please return without fail for worsening symptoms, including difficulty breathing, passing out, intractable vomiting, persistent swelling of throat or tongue, or any other symptoms concerning to you

## 2017-05-26 NOTE — ED Triage Notes (Signed)
Pt states she woke up x 30 mins ago with tingling to her lips and hives all over body; pt states her tongue feels thick and is having some sob; pt took 50mg  benadryl pta

## 2017-05-26 NOTE — ED Provider Notes (Signed)
[facility] EMERGENCY DEPARTMENT Provider Note   CSN: [id number] Arrival date & time: 05/26/17  0425     History   Chief Complaint Chief Complaint  Patient presents with  . Allergic Reaction    HPI Nicole Burgess is a 31 y.o. female.  The history is provided by the patient.  Allergic Reaction  Presenting symptoms: itching and swelling   Presenting symptoms: no difficulty breathing and no difficulty swallowing   Severity:  Moderate Prior allergic episodes:  No prior episodes Relieved by:  Antihistamines Worsened by:  Nothing Ineffective treatments:  None tried  31 year old female who presents with allergic reaction.  She reports being in her usual state of health.  She woke up early this morning with a tingling sensation over her lips.  She began to notice diffuse urticaria and pruritis. Noticed some swelling of the tongue. No nausea, vomiting, dyspnea, wheezing, or chest pain. No new foods, medications or other exposures. She took 50 mg of benadryl prior to arrival and things rash and swelling slowly improving.  Past Medical History:  Diagnosis Date  . Herpes   . Interstitial cystitis     Patient Active Problem List   Diagnosis Date Noted  . Vaginal delivery 08/14/2011    Past Surgical History:  Procedure Laterality Date  . CESAREAN SECTION      OB History    Gravida Para Term Preterm AB Living   4 3 3   1 3    SAB TAB Ectopic Multiple Live Births   1       3       Home Medications    Prior to Admission medications   Medication Sig Start Date End Date Taking? Authorizing Provider  cephALEXin (KEFLEX) 500 MG capsule Take 1 capsule (500 mg total) by mouth 4 (four) times daily. 03/31/15   Charlynne PanderYao, David Hsienta, MD  diphenhydrAMINE (BENADRYL) 25 mg capsule Take 2 capsules (50 mg total) every 6 (six) hours as needed by mouth for itching (hives). 05/26/17   Lavera GuiseLiu, Frieda Arnall Duo, MD  famotidine (PEPCID) 20 MG tablet Take 1 tablet (20 mg total) 2 (two) times daily by  mouth. 05/26/17   Lavera GuiseLiu, Kymiah Araiza Duo, MD  HYDROcodone-acetaminophen (NORCO/VICODIN) 5-325 MG per tablet Take 1 tablet by mouth every 4 (four) hours as needed. 09/16/14   Janne NapoleonNeese, Hope M, NP  meclizine (ANTIVERT) 25 MG tablet Take 1 tablet (25 mg total) by mouth 3 (three) times daily as needed for dizziness. 03/31/15   Charlynne PanderYao, David Hsienta, MD  predniSONE (DELTASONE) 10 MG tablet Take 5 tablets (50 mg total) daily for 5 days by mouth. 05/26/17 05/31/17  Lavera GuiseLiu, Aalayah Riles Duo, MD  prenatal vitamin w/FE, FA (PRENATAL 1 + 1) 27-1 MG TABS Take 1 tablet by mouth daily.      [provider]    Family History Family History  Problem Relation Age of Onset  . Mental retardation Mother        trisomy 5618  . Diabetes Paternal Grandmother     Social History Social History   Tobacco Use  . Smoking status: Never Smoker  . Smokeless tobacco: Never Used  Substance Use Topics  . Alcohol use: No  . Drug use: No     Allergies   Patient has no known allergies.   Review of Systems Review of Systems  HENT: Negative for trouble swallowing.   Respiratory: Negative for shortness of breath.   Cardiovascular: Negative for chest pain.  Skin: Positive for itching.  All other  systems reviewed and are negative.    Physical Exam Updated Vital Signs BP (!) 142/88 (BP Location: Left Arm)   Pulse (!) 102   Temp (!) 97.5 F (36.4 C) (Oral)   Resp 20   Ht 5\' 5"  (1.651 m)   Wt 99.8 kg (220 lb)   SpO2 96%   BMI 36.61 kg/m   Physical Exam Physical Exam  Nursing note and vitals reviewed. Constitutional: Well developed, well nourished, non-toxic, and in no acute distress Head: Normocephalic and atraumatic.  Mouth/Throat: Oropharynx is clear and moist. mild tongue edema. Neck: Normal range of motion. Neck supple.  Cardiovascular: Normal rate and regular rhythm.   Pulmonary/Chest: Effort normal and breath sounds normal.  Abdominal: Soft. There is no tenderness. There is no rebound and no guarding.    Musculoskeletal: Normal range of motion.  Neurological: Alert, no facial droop, fluent speech, moves all extremities symmetrically Skin: Skin is warm and dry. diffuse urticaria Psychiatric: Cooperative   ED Treatments / Results  Labs (all labs ordered are listed, but only abnormal results are displayed) Labs Reviewed  POC URINE PREG, ED    EKG  EKG Interpretation None       Radiology No results found.  Procedures Procedures (including critical care time)  Medications Ordered in ED Medications  diphenhydrAMINE (BENADRYL) injection 25 mg (25 mg Intravenous Given 05/26/17 0500)  famotidine (PEPCID) IVPB 20 mg premix (0 mg Intravenous Stopped 05/26/17 0546)  methylPREDNISolone sodium succinate (SOLU-MEDROL) 125 mg/2 mL injection 125 mg (125 mg Intravenous Given 05/26/17 0500)     Initial Impression / Assessment and Plan / ED Course  I have reviewed the triage vital signs and the nursing notes.  Pertinent labs & imaging results that were available during my care of the patient were reviewed by me and considered in my medical decision making (see chart for details).     31 year old female who presents with allergic reaction.  Reports that symptoms have been improving since taking Benadryl at home.  She is well-appearing and in no acute distress.  Hemodynamically stable, and she is in no respiratory distress.  Given steroids, Pepcid and small dose of additional Benadryl and observed in the ED. she has full resolution of her urticaria.  No longer feels that her tongue is thick.  Continues to be well-appearing, breathing comfortably.  At this time I do feel she is safe for discharge home.  Will send home with prescriptions for steroids, Benadryl and Pepcid. Strict return and follow-up instructions reviewed. She expressed understanding of all discharge instructions and felt comfortable with the plan of care.   Final Clinical Impressions(s) / ED Diagnoses   Final diagnoses:   Allergic reaction, initial encounter    ED Discharge Orders        Ordered    predniSONE (DELTASONE) 10 MG tablet  Daily     05/26/17 0558    diphenhydrAMINE (BENADRYL) 25 mg capsule  Every 6 hours PRN     05/26/17 0558    famotidine (PEPCID) 20 MG tablet  2 times daily     05/26/17 0558       Lavera GuiseLiu, Cassell Voorhies Duo, MD 05/26/17 [phone]

## 2018-06-17 ENCOUNTER — Encounter (HOSPITAL_COMMUNITY): Payer: Self-pay | Admitting: Emergency Medicine

## 2018-06-17 ENCOUNTER — Other Ambulatory Visit: Payer: Self-pay

## 2018-06-17 ENCOUNTER — Emergency Department (HOSPITAL_COMMUNITY): Payer: 59

## 2018-06-17 ENCOUNTER — Emergency Department (HOSPITAL_COMMUNITY)
Admission: EM | Admit: 2018-06-17 | Discharge: 2018-06-17 | Disposition: A | Discharge status: Home / Self Care | Payer: 59 | Attending: Emergency Medicine | Admitting: Emergency Medicine

## 2018-06-17 DIAGNOSIS — Z79899 Other long term (current) drug therapy: Secondary | ICD-10-CM | POA: Insufficient documentation

## 2018-06-17 DIAGNOSIS — R0789 Other chest pain: Secondary | ICD-10-CM | POA: Diagnosis not present

## 2018-06-17 DIAGNOSIS — R079 Chest pain, unspecified: Secondary | ICD-10-CM | POA: Diagnosis present

## 2018-06-17 LAB — D-DIMER, QUANTITATIVE: D-Dimer, Quant: <0.27 ug/mL-FEU (ref 0.00–0.50)

## 2018-06-17 LAB — CBC
HEMATOCRIT: 44.3 % (ref 36.0–46.0)
HEMOGLOBIN: 14.6 g/dL (ref 12.0–15.0)
MCH: 32 pg (ref 26.0–34.0)
MCHC: 33 g/dL (ref 30.0–36.0)
MCV: 97.1 fL (ref 80.0–100.0)
NRBC: 0 % (ref 0.0–0.2)
Platelets: 211 10*3/uL (ref 150–400)
RBC: 4.56 MIL/uL (ref 3.87–5.11)
RDW: 12.5 % (ref 11.5–15.5)
WBC: 9.4 10*3/uL (ref 4.0–10.5)

## 2018-06-17 LAB — BASIC METABOLIC PANEL
Anion gap: 7 (ref 5–15)
BUN: 13 mg/dL (ref 6–20)
CALCIUM: 9.1 mg/dL (ref 8.9–10.3)
CO2: 25 mmol/L (ref 22–32)
CREATININE: 0.85 mg/dL (ref 0.44–1.00)
Chloride: 105 mmol/L (ref 98–111)
Glucose, Bld: 116 mg/dL — ABNORMAL HIGH (ref 70–99)
POTASSIUM: 3.5 mmol/L (ref 3.5–5.1)
SODIUM: 137 mmol/L (ref 135–145)

## 2018-06-17 LAB — PREGNANCY, URINE: PREG TEST UR: NEGATIVE

## 2018-06-17 LAB — TROPONIN I: Troponin I: <0.03 ng/mL (ref <0.03)

## 2018-06-17 LAB — TSH: TSH: 2.385 u[IU]/mL (ref 0.350–4.500)

## 2018-06-17 MED ORDER — PREDNISONE 50 MG PO TABS
60.0000 mg | ORAL_TABLET | Freq: Once | ORAL | Status: AC
  Administered 2018-06-17: 60 mg via ORAL
  Filled 2018-06-17: qty 1

## 2018-06-17 MED ORDER — IBUPROFEN 600 MG PO TABS: 600.0000 mg | ORAL_TABLET | Freq: Four times a day (QID) | ORAL | 0 refills | Status: DC | PRN

## 2018-06-17 MED ORDER — KETOROLAC TROMETHAMINE 30 MG/ML IJ SOLN: 30.0000 mg | Freq: Once | INTRAMUSCULAR | Status: DC

## 2018-06-17 MED ORDER — PREDNISONE 10 MG (21) PO TBPK: ORAL_TABLET | ORAL | 0 refills | Status: DC

## 2018-06-17 MED ORDER — IBUPROFEN 800 MG PO TABS
800.0000 mg | ORAL_TABLET | Freq: Once | ORAL | Status: AC
  Administered 2018-06-17: 800 mg via ORAL
  Filled 2018-06-17: qty 1

## 2018-06-17 NOTE — ED Triage Notes (Signed)
Pt c/o left chest pain intermittently since Monday.

## 2018-06-17 NOTE — ED Notes (Signed)
ekg shown to Dr Charm BargesButler,

## 2018-06-17 NOTE — ED Provider Notes (Signed)
[facility] EMERGENCY DEPARTMENT Provider Note   CSN: [id number] Arrival date & time: 06/17/18  2024     History   Chief Complaint Chief Complaint  Patient presents with  . Chest Pain    HPI Nicole Burgess is a 32 y.o. female.  Pt presents to the ED today with CP.  She said it's been going on for the past few days.  She said it's intermittent.  She said she also feels like her heart skips beats.  She denies any sob.  No f/c.  No trauma.  She does not have any pain now.     Past Medical History:  Diagnosis Date  . Herpes   . Interstitial cystitis     Patient Active Problem List   Diagnosis Date Noted  . Vaginal delivery 08/14/2011    Past Surgical History:  Procedure Laterality Date  . CESAREAN SECTION    . OTHER SURGICAL HISTORY     fallopian tube removal.      OB History    Gravida  4   Para  3   Term  3   Preterm      AB  1   Living  3     SAB  1   TAB      Ectopic      Multiple      Live Births  3            Home Medications    Prior to Admission medications   Medication Sig Start Date End Date Taking? Authorizing Provider  Biotin 78295 MCG TABS Take 1 tablet by mouth daily.   Yes [provider]  FLUoxetine (PROZAC) 10 MG capsule Take 10 mg by mouth every evening.   Yes [provider]  ibuprofen (ADVIL,MOTRIN) 600 MG tablet Take 1 tablet (600 mg total) by mouth every 6 (six) hours as needed. 06/17/18   Jacalyn Lefevre, MD  predniSONE (STERAPRED UNI-PAK 21 TAB) 10 MG (21) TBPK tablet Take 6 tabs for 2 days, then 5 for 2 days, then 4 for 2 days, then 3 for 2 days, 2 for 2 days, then 1 for 2 days 06/17/18   Jacalyn Lefevre, MD    Family History Family History  Problem Relation Age of Onset  . Mental retardation Mother        trisomy 28  . Diabetes Paternal Grandmother     Social History Social History   Tobacco Use  . Smoking status: Never Smoker  . Smokeless tobacco: Never Used  Substance Use  Topics  . Alcohol use: No  . Drug use: No     Allergies   Sertraline   Review of Systems Review of Systems  Cardiovascular: Positive for chest pain.  All other systems reviewed and are negative.    Physical Exam Updated Vital Signs BP 106/68   Pulse (!) 103   Temp 97.8 F (36.6 C) (Oral)   Resp 18   Ht 5\' 5"  (1.651 m)   Wt 99.8 kg   LMP 06/17/2012 (Within Months)   SpO2 100%   BMI 36.61 kg/m   Physical Exam  Constitutional: She is oriented to person, place, and time. She appears well-developed and well-nourished.  HENT:  Head: Normocephalic and atraumatic.  Eyes: Pupils are equal, round, and reactive to light. EOM are normal.  Neck: Normal range of motion. Neck supple.  Cardiovascular: Normal rate, regular rhythm, intact distal pulses and normal pulses.  Pulmonary/Chest: Effort normal and breath sounds normal.  Abdominal: Soft. Bowel sounds are normal.  Musculoskeletal: Normal range of motion.       Right lower leg: Normal.       Left lower leg: Normal.  Neurological: She is alert and oriented to person, place, and time.  Skin: Skin is warm and dry. Capillary refill takes less than 2 seconds.  Psychiatric: She has a normal mood and affect. Her behavior is normal.  Nursing note and vitals reviewed.    ED Treatments / Results  Labs (all labs ordered are listed, but only abnormal results are displayed) Labs Reviewed  BASIC METABOLIC PANEL - Abnormal; Notable for the following components:      Result Value   Glucose, Bld 116 (*)    All other components within normal limits  CBC  TROPONIN I  PREGNANCY, URINE  D-DIMER, QUANTITATIVE (NOT AT [facility])  TSH    EKG EKG Interpretation  Date/Time:  Wednesday June 17 2018 20:31:20 EST Ventricular Rate:  99 PR Interval:  130 QRS Duration: 88 QT Interval:  364 QTC Calculation: 467 R Axis:   75 Text Interpretation:  Normal sinus rhythm with sinus arrhythmia Abnormal QRS-T angle, consider primary T wave  abnormality Abnormal ECG No old tracing to compare Confirmed by Jacalyn LefevreHaviland, Virga Haltiwanger [phone]([id number]) on 06/17/2018 8:57:42 PM   Radiology Dg Chest 2 View  Result Date: 06/17/2018 CLINICAL DATA:  32 y/o F; left-sided chest pain on and off since Monday. EXAM: CHEST - 2 VIEW COMPARISON:  None. FINDINGS: Normal cardiac silhouette given projection and technique. No pneumothorax or effusion. Bones are unremarkable. Increased reticular opacities of the lungs. IMPRESSION: Increased reticular opacities of the lungs may represent vascular congestion, bronchitic changes, or atypical pneumonia. Electronically Signed   By: Mitzi HansenLance  Furusawa-Stratton M.D.   On: 06/17/2018 22:07    Procedures Procedures (including critical care time)  Medications Ordered in ED Medications  predniSONE (DELTASONE) tablet 60 mg (has no administration in time range)  ibuprofen (ADVIL,MOTRIN) tablet 800 mg (has no administration in time range)     Initial Impression / Assessment and Plan / ED Course  I have reviewed the triage vital signs and the nursing notes.  Pertinent labs & imaging results that were available during my care of the patient were reviewed by me and considered in my medical decision making (see chart for details).     Pt has a heart score of 0.  Troponin and EKG ok.  Ddimer negative.  Pt will be started on ibuprofen and prednisone here and given her first dose here.  Return if worse.  Final Clinical Impressions(s) / ED Diagnoses   Final diagnoses:  Atypical chest pain    ED Discharge Orders         Ordered    ibuprofen (ADVIL,MOTRIN) 600 MG tablet  Every 6 hours PRN     06/17/18 2219    predniSONE (STERAPRED UNI-PAK 21 TAB) 10 MG (21) TBPK tablet     06/17/18 2219           Jacalyn LefevreHaviland, Arlisha Patalano, MD 06/17/18 2221

## 2018-06-25 ENCOUNTER — Other Ambulatory Visit: Payer: Self-pay

## 2018-06-25 ENCOUNTER — Encounter (HOSPITAL_COMMUNITY): Payer: Self-pay

## 2018-06-25 ENCOUNTER — Emergency Department (HOSPITAL_COMMUNITY)
Admission: EM | Admit: 2018-06-25 | Discharge: 2018-06-25 | Disposition: A | Discharge status: Home / Self Care | Payer: 59 | Attending: Emergency Medicine | Admitting: Emergency Medicine

## 2018-06-25 ENCOUNTER — Emergency Department (HOSPITAL_COMMUNITY): Payer: 59

## 2018-06-25 DIAGNOSIS — Z79899 Other long term (current) drug therapy: Secondary | ICD-10-CM | POA: Insufficient documentation

## 2018-06-25 DIAGNOSIS — R0602 Shortness of breath: Secondary | ICD-10-CM

## 2018-06-25 DIAGNOSIS — R0789 Other chest pain: Secondary | ICD-10-CM | POA: Insufficient documentation

## 2018-06-25 LAB — CBC
HEMATOCRIT: 43.9 % (ref 36.0–46.0)
HEMOGLOBIN: 15.1 g/dL — AB (ref 12.0–15.0)
MCH: 33.4 pg (ref 26.0–34.0)
MCHC: 34.4 g/dL (ref 30.0–36.0)
MCV: 97.1 fL (ref 80.0–100.0)
Platelets: 241 10*3/uL (ref 150–400)
RBC: 4.52 MIL/uL (ref 3.87–5.11)
RDW: 12.6 % (ref 11.5–15.5)
WBC: 14.1 10*3/uL — AB (ref 4.0–10.5)
nRBC: 0 % (ref 0.0–0.2)

## 2018-06-25 LAB — BASIC METABOLIC PANEL
Anion gap: 11 (ref 5–15)
BUN: 18 mg/dL (ref 6–20)
CALCIUM: 9.5 mg/dL (ref 8.9–10.3)
CHLORIDE: 106 mmol/L (ref 98–111)
CO2: 22 mmol/L (ref 22–32)
CREATININE: 0.81 mg/dL (ref 0.44–1.00)
GFR calc non Af Amer: >60 mL/min (ref >60)
Glucose, Bld: 101 mg/dL — ABNORMAL HIGH (ref 70–99)
Potassium: 4.1 mmol/L (ref 3.5–5.1)
Sodium: 139 mmol/L (ref 135–145)

## 2018-06-25 LAB — D-DIMER, QUANTITATIVE: D-Dimer, Quant: <0.27 ug/mL-FEU (ref 0.00–0.50)

## 2018-06-25 LAB — I-STAT TROPONIN, ED: Troponin i, poc: 0 ng/mL (ref 0.00–0.08)

## 2018-06-25 NOTE — ED Triage Notes (Signed)
Pt reports constant chest pressure that started about a week ago. Left sided, 6/10 pain, initially ibuprofen helped ease pain but now states is ineffective. Nothing makes pressure better or worse. Was worked up last week at [facility] and was told it was pleurisy. Currently taking prednisone 10 mg and ibuprofen 600 mg Does endorse lightheadedness/"fuzzy headed" while sitting.

## 2018-06-25 NOTE — Discharge Instructions (Addendum)
Work-up today without any significant findings.  No evidence of any significant problem with the heart or lungs.  Not clear what exactly is causing the symptoms recommend follow-up with the wellness clinic.  Return for any new or worse symptoms.  Would recommend just stopping the prednisone, the steroid.  Could try a course of Aleve over-the-counter 1 or 2 tablets every 12 hours for the next 7 days.

## 2018-06-25 NOTE — ED Provider Notes (Signed)
[facility] EMERGENCY DEPARTMENT Provider Note   CSN: [id number] Arrival date & time: 06/25/18  1456     History   Chief Complaint Chief Complaint  Patient presents with  . Chest Pain    HPI Nicole Burgess is a 32 y.o. female.  Patient seen December 11 for chest pain shortness of breath.  Chest pain was sharp at that time now it is more of a dull ache.  Patient with extensive work-up to include negative pregnancy test.  Normal troponin.  Negative d-dimer.  Thyroid-stimulating hormone was normal.  Chest x-ray was negative.  Patient was discharged home with a course of prednisone.  Patient states did not seem to make much difference.  Here recently she has had some lightheadedness no dizziness no vertigo had some intermittent numbness around her mouth.  No other focal neuro deficits.     Past Medical History:  Diagnosis Date  . Herpes   . Interstitial cystitis     Patient Active Problem List   Diagnosis Date Noted  . Vaginal delivery 08/14/2011    Past Surgical History:  Procedure Laterality Date  . CESAREAN SECTION    . OTHER SURGICAL HISTORY     fallopian tube removal.      OB History    Gravida  4   Para  3   Term  3   Preterm      AB  1   Living  3     SAB  1   TAB      Ectopic      Multiple      Live Births  3            Home Medications    Prior to Admission medications   Medication Sig Start Date End Date Taking? Authorizing Provider  Biotin 78295 MCG TABS Take 1 tablet by mouth daily.    [provider]  FLUoxetine (PROZAC) 10 MG capsule Take 10 mg by mouth every evening.    [provider]  ibuprofen (ADVIL,MOTRIN) 600 MG tablet Take 1 tablet (600 mg total) by mouth every 6 (six) hours as needed. 06/17/18   Jacalyn Lefevre, MD  predniSONE (STERAPRED UNI-PAK 21 TAB) 10 MG (21) TBPK tablet Take 6 tabs for 2 days, then 5 for 2 days, then 4 for 2 days, then 3 for 2 days, 2 for 2 days, then 1  for 2 days 06/17/18   Jacalyn Lefevre, MD    Family History Family History  Problem Relation Age of Onset  . Mental retardation Mother        trisomy 79  . Diabetes Paternal Grandmother     Social History Social History   Tobacco Use  . Smoking status: Never Smoker  . Smokeless tobacco: Never Used  Substance Use Topics  . Alcohol use: No  . Drug use: No     Allergies   Sertraline   Review of Systems Review of Systems  Constitutional: Negative for fever.  HENT: Negative for congestion.   Eyes: Negative for visual disturbance.  Respiratory: Positive for shortness of breath.   Cardiovascular: Positive for chest pain.  Gastrointestinal: Negative for abdominal pain, nausea and vomiting.  Genitourinary: Negative for dysuria.  Musculoskeletal: Negative for back pain and neck pain.  Skin: Negative for rash.  Neurological: Positive for light-headedness and numbness. Negative for dizziness, syncope, speech difficulty, weakness and headaches.  Hematological: Does not bruise/bleed easily.  Psychiatric/Behavioral: Negative for confusion.     Physical  Exam Updated Vital Signs Pulse (!) 110   Temp 98.4 F (36.9 C) (Oral)   Resp 17   Ht 1.651 m (5\' 5" )   Wt 99.8 kg   SpO2 100%   BMI 36.61 kg/m   Physical Exam Vitals signs and nursing note reviewed.  Constitutional:      General: She is not in acute distress.    Appearance: Normal appearance.  HENT:     Head: Normocephalic and atraumatic.     Mouth/Throat:     Mouth: Mucous membranes are moist.  Eyes:     Extraocular Movements: Extraocular movements intact.     Conjunctiva/sclera: Conjunctivae normal.     Pupils: Pupils are equal, round, and reactive to light.  Neck:     Musculoskeletal: Neck supple.  Cardiovascular:     Rate and Rhythm: Regular rhythm. Tachycardia present.  Pulmonary:     Effort: Pulmonary effort is normal.     Breath sounds: Normal breath sounds. No wheezing.  Abdominal:     General:  Bowel sounds are normal.     Palpations: Abdomen is soft.     Tenderness: There is no abdominal tenderness.  Musculoskeletal: Normal range of motion.  Skin:    General: Skin is warm.     Findings: No rash.  Neurological:     General: No focal deficit present.     Mental Status: She is alert and oriented to person, place, and time.     Cranial Nerves: No cranial nerve deficit.     Sensory: No sensory deficit.     Motor: No weakness.      ED Treatments / Results  Labs (all labs ordered are listed, but only abnormal results are displayed) Labs Reviewed  CBC - Abnormal; Notable for the following components:      Result Value   WBC 14.1 (*)    Hemoglobin 15.1 (*)    All other components within normal limits  BASIC METABOLIC PANEL - Abnormal; Notable for the following components:   Glucose, Bld 101 (*)    All other components within normal limits  D-DIMER, QUANTITATIVE (NOT AT [facility])  I-STAT TROPONIN, ED    EKG EKG Interpretation  Date/Time:  Thursday June 25 2018 15:22:42 EST Ventricular Rate:  103 PR Interval:    QRS Duration: 92 QT Interval:  351 QTC Calculation: 460 R Axis:   77 Text Interpretation:  Sinus tachycardia Nonspecific T abnormalities, inferior leads Confirmed by Vanetta MuldersZackowski, Novie Maggio [phone]([id number] on 06/25/2018 3:59:27 PM   Radiology Dg Chest 2 View  Result Date: 06/25/2018 CLINICAL DATA:  Chest pain EXAM: CHEST - 2 VIEW COMPARISON:  June 17, 2018 FINDINGS: There is no edema or consolidation. Heart size and pulmonary vascularity are normal. No adenopathy. No pneumothorax. No bone lesions. IMPRESSION: No edema or consolidation. Electronically Signed   By: Bretta BangWilliam  Woodruff III M.D.   On: 06/25/2018 16:18    Procedures Procedures (including critical care time)  Medications Ordered in ED Medications - No data to display   Initial Impression / Assessment and Plan / ED Course  I have reviewed the triage vital signs and the nursing notes.  Pertinent labs  & imaging results that were available during my care of the patient were reviewed by me and considered in my medical decision making (see chart for details).    Repeat work-up of similar symptoms for which patient was seen on December 11 without any acute findings.  Troponin was normal.  D-dimer normal.  No concerns for pulmonary  embolus no hypoxia.  EKG without any acute changes.  Chest x-ray negative.  White blood cell count was elevated but she has been on a course of prednisone.  Patient also with complaint of some intermittent numbness to the lip area.  No significant findings on neuro exam will have patient follow-up with wellness clinic.  Patient's pregnancy test was negative at previous visit.   Final Clinical Impressions(s) / ED Diagnoses   Final diagnoses:  Atypical chest pain  SOB (shortness of breath)    ED Discharge Orders    None       Vanetta MuldersZackowski, Kaisyn Millea, MD 06/25/18 1921

## 2019-05-07 ENCOUNTER — Other Ambulatory Visit: Payer: Self-pay

## 2019-05-07 ENCOUNTER — Ambulatory Visit (INDEPENDENT_AMBULATORY_CARE_PROVIDER_SITE_OTHER): Payer: 59

## 2019-05-07 ENCOUNTER — Ambulatory Visit (INDEPENDENT_AMBULATORY_CARE_PROVIDER_SITE_OTHER): Payer: 59 | Admitting: Podiatry

## 2019-05-07 ENCOUNTER — Encounter: Payer: Self-pay | Admitting: Podiatry

## 2019-05-07 DIAGNOSIS — M722 Plantar fascial fibromatosis: Secondary | ICD-10-CM

## 2019-05-07 MED ORDER — METHYLPREDNISOLONE 4 MG PO TBPK: ORAL_TABLET | ORAL | 0 refills | Status: DC

## 2019-05-07 MED ORDER — DICLOFENAC SODIUM 75 MG PO TBEC: 75.0000 mg | DELAYED_RELEASE_TABLET | Freq: Two times a day (BID) | ORAL | 1 refills | Status: DC

## 2019-05-11 NOTE — Progress Notes (Signed)
   Subjective: 33 y.o. female presenting today as a new patient with a chief complaint of pain to the bilateral heels and arches that began 3-4 months ago. She states it feels like her arches are pulling apart and she is walking on a bruise. She states the pain is worse in the morning and when she stands after being seated for a long period of time. She has been taking Ibuprofen, Tylenol and using compression socks for treatment. Patient is here for further evaluation and treatment.    Past Medical History:  Diagnosis Date  . Herpes   . Interstitial cystitis      Objective: Physical Exam General: The patient is alert and oriented x3 in no acute distress.  Dermatology: Skin is warm, dry and supple bilateral lower extremities. Negative for open lesions or macerations bilateral.   Vascular: Dorsalis Pedis and Posterior Tibial pulses palpable bilateral.  Capillary fill time is immediate to all digits.  Neurological: Epicritic and protective threshold intact bilateral.   Musculoskeletal: Tenderness to palpation to the plantar aspect of the bilateral heels along the plantar fascia. All other joints range of motion within normal limits bilateral. Strength 5/5 in all groups bilateral.   Radiographic exam: Normal osseous mineralization. Joint spaces preserved. No fracture/dislocation/boney destruction. No other soft tissue abnormalities or radiopaque foreign bodies.   Assessment: 1. plantar fasciitis bilateral feet, left greater than right   Plan of Care:  1. Patient evaluated. Xrays reviewed.   2. Injection of 0.5cc Celestone soluspan injected into the left heel.  3. Rx for Medrol Dose Pak placed 4. Rx for Diclofenac ordered for patient. 5. OTC Powerstep insoles dispensed.  6. Instructed patient regarding therapies and modalities at home to alleviate symptoms.  7. Return to clinic in 4 weeks.    Glass blower/designer at [employer].    Edrick Kins, DPM [facility]  Dr.  Edrick Kins, DPM    [address], [place] [address]                Office [phone]  Fax [phone]

## 2019-06-11 ENCOUNTER — Ambulatory Visit: Payer: 59 | Admitting: Podiatry

## 2019-08-02 DIAGNOSIS — Z8616 Personal history of COVID-19: Secondary | ICD-10-CM

## 2019-08-02 HISTORY — DX: Personal history of COVID-19: Z86.16

## 2022-08-15 ENCOUNTER — Other Ambulatory Visit: Payer: Self-pay | Admitting: Urology

## 2022-09-09 ENCOUNTER — Other Ambulatory Visit: Payer: Self-pay

## 2022-09-09 ENCOUNTER — Encounter (HOSPITAL_BASED_OUTPATIENT_CLINIC_OR_DEPARTMENT_OTHER): Payer: Self-pay | Admitting: Urology

## 2022-09-09 NOTE — Progress Notes (Addendum)
Spoke w/ via phone for pre-op interview--- pt Lab needs dos---- UPT               Lab results------ COVID test -----patient states asymptomatic no test needed Arrive at ------- 0530 NPO after MN NO Solid Food.  Clear liquids from MN until--- 0430 Med rec completed Medications to take morning of surgery -----  Diabetic medication ----- N/A Patient instructed no nail polish to be worn day of surgery Patient instructed to bring photo id and insurance card day of surgery Patient aware to have Driver (ride ) / caregiver    for 24 hours after surgery: Nicole Burgess Patient Special Instructions ----- N/A Pre-Op special Istructions ----- Patient verbalized understanding of instructions that were given at this phone interview. Patient denies shortness of breath, chest pain, fever, cough at this phone interview.

## 2022-09-11 NOTE — H&P (Signed)
CC: painful voiding.   Hx: Nicole Burgess is a 37 yo female who was last seen about 10 years ago for IC by Dr. Gaynelle Arabian. She had cystoscopy and HOD at that time. She has been seen at [facility] for the last few years and has been found to have urethral stenosis requiring dilation and a foley. The last was in 2018 by Dr. Jacqlyn Larsen in [place]. She had a 42f stricture. She will get relief of the pain with the dilations but now she is just dealing with it. She gets pain with a full bladder and pain with voiding. The bladder pain is intermittent and related to diet. She has variable frequency and nocturia. Her stream is weak. She used to get UTI's but not recently. She has had no stones. She had UDS previously that showed a 3339mbladder with early sensation. She has some urgency but not much incontinence.     ALLERGIES: No Allergies    MEDICATIONS: Depakote 500 mg tablet, delayed release  Pristiq 100 mg tablet, extended release 24 hr  Vraylar     GU PSH: No GU PSH      PSH Notes: Cesarean Section,  tubal ligation  Cystoscopy with HOD  Urethral dilation x 2.    NON-GU PSH: Cesarean Delivery Only - 2010     GU PMH: Dysuria, Dysuria - 2014 Interstitial Cystitis (w/o hematuria), Chronic interstitial cystitis without hematuria - 2014 Personal Hx Oth Urinary System diseases, History of chronic cystitis - 2014      PMH Notes:  1898-07-08 00:00:00 - Note: Normal Routine History And Physical Adult   NON-GU PMH: Anxiety Arthritis    FAMILY HISTORY: Family Health Status - Father alive at age 35 Ru67n Family Family Health Status - Mother's Age [facility] [place] - Father Hyperthyroidism - Mother melanoma - Grandmother nephrolithiasis - Father   SOCIAL HISTORY: Marital Status: Married Preferred Language: English; Ethnicity: Not Hispanic Or Latino; Race: White Current Smoking Status: Patient has never smoked.   Tobacco Use Assessment Completed: Used Tobacco in last 30  days? Does not use smokeless tobacco. Has never drank.  Does not use drugs. Drinks 2 caffeinated drinks per day. 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     Notes: Caffeine Use, Marital History - Currently Married, Alcohol Use, Tobacco Use, Occupation:   REVIEW OF SYSTEMS:    GU Review Female:   Patient reports hard to postpone urination, burning /pain with urination, leakage of urine, and stream starts and stops. Patient denies frequent urination, get up at night to urinate, trouble starting your stream, have to strain to urinate, and being pregnant.  Gastrointestinal (Upper):   Patient denies nausea, vomiting, and indigestion/ heartburn.  Gastrointestinal (Lower):   Patient denies diarrhea and constipation.  Constitutional:   Patient denies fever, night sweats, weight loss, and fatigue.  Skin:   Patient denies skin rash/ lesion and itching.  Eyes:   Patient denies blurred vision and double vision.  Ears/ Nose/ Throat:   Patient denies sore throat and sinus problems.  Hematologic/Lymphatic:   Patient denies swollen glands and easy bruising.  Cardiovascular:   Patient denies leg swelling and chest pains.  Respiratory:   Patient denies shortness of breath and cough.  Endocrine:   Patient denies excessive thirst.  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  Patient reports back pain and joint pain.   Neurological:   Patient denies headaches and dizziness.  Psychologic:   Patient reports depression and anxiety.    Notes: Weak stream    VITAL SIGNS:      08/09/2022 02:20 PM  Weight 170 lb / 77.11 kg  Height 66 in / 167.64 cm  BP 118/79 mmHg  Heart Rate 96 /min  Temperature 98.8 F / 37.1 C  BMI 27.4 kg/m   GU PHYSICAL EXAMINATION:    External Genitalia: No hirsutism, no rash, no scarring, no cyst, no erythematous lesion, no papular lesion, no blanched lesion, no warty lesion. No edema.  Urethral Meatus:  Normal size. Normal position. No discharge.  Urethra: No tenderness, no mass, no scarring. No urethral hypermobility. No leakage.   Bladder: Bladder tender. Normal to palpation, no mass, normal size.   Vagina: No atrophy, no stenosis. No rectocele. No cystocele. No enterocele.   MULTI-SYSTEM PHYSICAL EXAMINATION:    Constitutional: Well-nourished. No physical deformities. Normally developed. Good grooming.  Neck: Neck symmetrical, not swollen. Normal tracheal position.  Respiratory: No labored breathing, no use of accessory muscles.   Cardiovascular: Normal temperature, normal extremity pulses, no swelling, no varicosities.  Lymphatic: No enlargement of neck, axillae, groin.  Skin: No paleness, no jaundice, no cyanosis. No lesion, no ulcer, no rash.  Neurologic / Psychiatric: Oriented to time, oriented to place, oriented to person. No depression, no anxiety, no agitation.  Gastrointestinal: No mass, no tenderness, no rigidity, non obese abdomen.  Eyes: Normal conjunctivae. Normal eyelids.  Ears, Nose, Mouth, and Throat: Left ear no scars, no lesions, no masses. Right ear no scars, no lesions, no masses. Nose no scars, no lesions, no masses. Normal hearing. Normal lips.  Musculoskeletal: Normal gait and station of head and neck.     Complexity of Data:  Records Review:   Previous Doctor Records, Previous Patient Records  Urine Test Review:   Urinalysis  Urodynamics Review:   Review Bladder Scan  Notes:                     [facility] records reviewed.    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 I discussed options and with the IC and bladder pain, I will get her set up to go to the OR for cystoscopy with urethral dilation with optilume balloon application, HOD and instillation of P& M. I have reviewed the risks of bleeding, infection, urethral and bladder injury, incotinence, recurrent stricture, need for secondary procedure, thrombotic events and anesthetic complications as well as precautions with conception related to the drug coated balloon.   2   Interstitial Cystitis (w/o hematuria) - N30.10 Chronic, Worsening  3   Dysuria - R30.0 Chronic, Stable  4   Weak Urinary Stream - R39.12 Chronic, Stable  5   Incomplete bladder emptying - R39.14 Undiagnosed New Problem   PLAN:  Medications Stop Meds: Darcalma 81.6 mg-10.8 mg-40.8 mg-36.2 mg-0.12 mg tablet 0 Oral  Start: 08/04/2008  Discontinue: 08/09/2022  - Reason: The medication cycle was completed.  Elmiron 100 mg capsule 0 Oral 3 times daily  Start: 07/13/2008  Discontinue: 08/09/2022  - Reason: The medication cycle was completed.  Trimethoprim 100 mg tablet 0 Oral  Start: 07/13/2008  Discontinue: 08/09/2022  - Reason: The medication cycle was completed.            Schedule Return Visit/Planned Activity: Next Available Appointment - Schedule Surgery

## 2022-09-13 ENCOUNTER — Ambulatory Visit (HOSPITAL_BASED_OUTPATIENT_CLINIC_OR_DEPARTMENT_OTHER): Payer: BC Managed Care – PPO | Admitting: Certified Registered Nurse Anesthetist

## 2022-09-13 ENCOUNTER — Ambulatory Visit (HOSPITAL_BASED_OUTPATIENT_CLINIC_OR_DEPARTMENT_OTHER)
Admission: RE | Admit: 2022-09-13 | Discharge: 2022-09-13 | Disposition: A | Discharge status: Home / Self Care | Payer: BC Managed Care – PPO | Attending: Urology | Admitting: Urology

## 2022-09-13 ENCOUNTER — Encounter (HOSPITAL_BASED_OUTPATIENT_CLINIC_OR_DEPARTMENT_OTHER)
Admission: RE | Disposition: A | Discharge status: Home / Self Care | Payer: Self-pay | Source: Home / Self Care | Attending: Urology

## 2022-09-13 ENCOUNTER — Encounter (HOSPITAL_BASED_OUTPATIENT_CLINIC_OR_DEPARTMENT_OTHER): Payer: Self-pay | Admitting: Urology

## 2022-09-13 DIAGNOSIS — N3010 Interstitial cystitis (chronic) without hematuria: Secondary | ICD-10-CM | POA: Insufficient documentation

## 2022-09-13 DIAGNOSIS — N3592 Unspecified urethral stricture, female: Secondary | ICD-10-CM | POA: Diagnosis present

## 2022-09-13 DIAGNOSIS — Z01818 Encounter for other preprocedural examination: Secondary | ICD-10-CM

## 2022-09-13 HISTORY — PX: CYSTO WITH HYDRODISTENSION: SHX5453

## 2022-09-13 HISTORY — PX: CYSTOSCOPY WITH URETHRAL DILATATION: SHX5125

## 2022-09-13 LAB — POCT PREGNANCY, URINE: Preg Test, Ur: NEGATIVE

## 2022-09-13 SURGERY — CYSTOSCOPY, WITH URETHRAL DILATION
Anesthesia: General | Site: Urethra

## 2022-09-13 MED ORDER — FENTANYL CITRATE (PF) 100 MCG/2ML IJ SOLN
INTRAMUSCULAR | Status: AC
  Filled 2022-09-13: qty 2

## 2022-09-13 MED ORDER — FENTANYL CITRATE (PF) 100 MCG/2ML IJ SOLN
25.0000 ug | INTRAMUSCULAR | Status: DC | PRN
  Administered 2022-09-13: 25 ug via INTRAVENOUS

## 2022-09-13 MED ORDER — SODIUM CHLORIDE 0.9% FLUSH: 3.0000 mL | Freq: Two times a day (BID) | INTRAVENOUS | Status: DC

## 2022-09-13 MED ORDER — STERILE WATER FOR IRRIGATION IR SOLN
Status: DC | PRN
  Administered 2022-09-13: 3000 mL
  Administered 2022-09-13: 500 mL

## 2022-09-13 MED ORDER — MEPERIDINE HCL 25 MG/ML IJ SOLN: 6.2500 mg | INTRAMUSCULAR | Status: DC | PRN

## 2022-09-13 MED ORDER — CEPHALEXIN 500 MG PO CAPS: 500.0000 mg | ORAL_CAPSULE | Freq: Three times a day (TID) | ORAL | 0 refills | Status: AC

## 2022-09-13 MED ORDER — ONDANSETRON HCL 4 MG/2ML IJ SOLN
INTRAMUSCULAR | Status: AC
  Filled 2022-09-13: qty 2

## 2022-09-13 MED ORDER — MIDAZOLAM HCL 2 MG/2ML IJ SOLN
INTRAMUSCULAR | Status: AC
  Filled 2022-09-13: qty 2

## 2022-09-13 MED ORDER — LIDOCAINE 2% (20 MG/ML) 5 ML SYRINGE
INTRAMUSCULAR | Status: DC | PRN
  Administered 2022-09-13: 100 mg via INTRAVENOUS

## 2022-09-13 MED ORDER — ONDANSETRON HCL 4 MG/2ML IJ SOLN: 4.0000 mg | Freq: Once | INTRAMUSCULAR | Status: DC | PRN

## 2022-09-13 MED ORDER — KETOROLAC TROMETHAMINE 30 MG/ML IJ SOLN: 30.0000 mg | Freq: Once | INTRAMUSCULAR | Status: DC | PRN

## 2022-09-13 MED ORDER — DEXMEDETOMIDINE HCL IN NACL 80 MCG/20ML IV SOLN
INTRAVENOUS | Status: DC | PRN
  Administered 2022-09-13: 8 ug via BUCCAL

## 2022-09-13 MED ORDER — ACETAMINOPHEN 160 MG/5ML PO SOLN: 325.0000 mg | ORAL | Status: DC | PRN

## 2022-09-13 MED ORDER — LACTATED RINGERS IV SOLN: INTRAVENOUS | Status: DC

## 2022-09-13 MED ORDER — PROPOFOL 10 MG/ML IV BOLUS
INTRAVENOUS | Status: AC
  Filled 2022-09-13: qty 20

## 2022-09-13 MED ORDER — PROPOFOL 10 MG/ML IV BOLUS
INTRAVENOUS | Status: DC | PRN
  Administered 2022-09-13: 150 mg via INTRAVENOUS

## 2022-09-13 MED ORDER — DEXAMETHASONE SODIUM PHOSPHATE 10 MG/ML IJ SOLN
INTRAMUSCULAR | Status: DC | PRN
  Administered 2022-09-13: 10 mg via INTRAVENOUS

## 2022-09-13 MED ORDER — OXYCODONE HCL 5 MG PO TABS
5.0000 mg | ORAL_TABLET | Freq: Once | ORAL | Status: AC | PRN
  Administered 2022-09-13: 5 mg via ORAL

## 2022-09-13 MED ORDER — CEFAZOLIN SODIUM-DEXTROSE 2-4 GM/100ML-% IV SOLN
INTRAVENOUS | Status: AC
  Filled 2022-09-13: qty 100

## 2022-09-13 MED ORDER — TRAMADOL HCL 50 MG PO TABS: 50.0000 mg | ORAL_TABLET | Freq: Four times a day (QID) | ORAL | 0 refills | Status: DC | PRN

## 2022-09-13 MED ORDER — OXYCODONE HCL 5 MG PO TABS
ORAL_TABLET | ORAL | Status: AC
  Filled 2022-09-13: qty 1

## 2022-09-13 MED ORDER — OXYCODONE HCL 5 MG/5ML PO SOLN: 5.0000 mg | Freq: Once | ORAL | Status: AC | PRN

## 2022-09-13 MED ORDER — DEXAMETHASONE SODIUM PHOSPHATE 10 MG/ML IJ SOLN
INTRAMUSCULAR | Status: AC
  Filled 2022-09-13: qty 1

## 2022-09-13 MED ORDER — LIDOCAINE HCL (PF) 2 % IJ SOLN
INTRAMUSCULAR | Status: AC
  Filled 2022-09-13: qty 5

## 2022-09-13 MED ORDER — ONDANSETRON HCL 4 MG/2ML IJ SOLN
INTRAMUSCULAR | Status: DC | PRN
  Administered 2022-09-13: 4 mg via INTRAVENOUS

## 2022-09-13 MED ORDER — FENTANYL CITRATE (PF) 250 MCG/5ML IJ SOLN
INTRAMUSCULAR | Status: DC | PRN
  Administered 2022-09-13 (×2): 25 ug via INTRAVENOUS
  Administered 2022-09-13: 50 ug via INTRAVENOUS

## 2022-09-13 MED ORDER — MIDAZOLAM HCL 2 MG/2ML IJ SOLN
INTRAMUSCULAR | Status: DC | PRN
  Administered 2022-09-13: 2 mg via INTRAVENOUS

## 2022-09-13 MED ORDER — ACETAMINOPHEN 325 MG PO TABS: 325.0000 mg | ORAL_TABLET | ORAL | Status: DC | PRN

## 2022-09-13 MED ORDER — CEFAZOLIN SODIUM-DEXTROSE 2-4 GM/100ML-% IV SOLN
2.0000 g | INTRAVENOUS | Status: AC
  Administered 2022-09-13: 2 g via INTRAVENOUS

## 2022-09-13 SURGICAL SUPPLY — 36 items
BAG DRAIN URO-CYSTO SKYTR STRL (DRAIN) ×1 IMPLANT
BAG DRN UROCATH (DRAIN) ×1
BALLN NEPHROSTOMY (BALLOONS) ×1
BALLN OPTILUME DCB 30X3X75 (BALLOONS) ×1
BALLN OPTILUME DCB 30X5X75 (BALLOONS)
BALLOON NEPHROSTOMY (BALLOONS) ×1 IMPLANT
BALLOON OPTILUME DCB 30X3X75 (BALLOONS) IMPLANT
BALLOON OPTILUME DCB 30X5X75 (BALLOONS) IMPLANT
CATH COUNCIL 22FR (CATHETERS) IMPLANT
CATH FOLEY 2W COUNCIL 20FR 5CC (CATHETERS) IMPLANT
CATH FOLEY 2W COUNCIL 5CC 16FR (CATHETERS) IMPLANT
CATH FOLEY 2W COUNCIL 5CC 18FR (CATHETERS) IMPLANT
CATH FOLEY 2WAY SLVR  5CC 16FR (CATHETERS) ×1
CATH FOLEY 2WAY SLVR 5CC 16FR (CATHETERS) IMPLANT
CATH ROBINSON RED A/P 16FR (CATHETERS) ×1 IMPLANT
CATH SILICONE 14FRX5CC (CATHETERS) IMPLANT
CLOTH BEACON ORANGE TIMEOUT ST (SAFETY) ×2 IMPLANT
ELECT REM PT RETURN 9FT ADLT (ELECTROSURGICAL)
ELECTRODE REM PT RTRN 9FT ADLT (ELECTROSURGICAL) ×1 IMPLANT
GLOVE SURG SS PI 8.0 STRL IVOR (GLOVE) ×1 IMPLANT
GOWN STRL REUS W/TWL XL LVL3 (GOWN DISPOSABLE) ×1 IMPLANT
GUIDEWIRE STR DUAL SENSOR (WIRE) IMPLANT
KIT TURNOVER CYSTO (KITS) ×1 IMPLANT
MANIFOLD NEPTUNE II (INSTRUMENTS) ×1 IMPLANT
NDL HYPO 22X1.5 SAFETY MO (MISCELLANEOUS) IMPLANT
NDL SAFETY ECLIP 18X1.5 (MISCELLANEOUS) IMPLANT
NEEDLE HYPO 22X1.5 SAFETY MO (MISCELLANEOUS) IMPLANT
NEEDLE SAFETY HYPO 22GAX1.5 (MISCELLANEOUS)
NS IRRIG 500ML POUR BTL (IV SOLUTION) IMPLANT
PACK CYSTO (CUSTOM PROCEDURE TRAY) ×1 IMPLANT
PLUG CATH AND CAP STER (CATHETERS) IMPLANT
SLEEVE SCD COMPRESS KNEE MED (STOCKING) ×1 IMPLANT
SYR 20ML LL LF (SYRINGE) IMPLANT
SYR 30ML LL (SYRINGE) ×1 IMPLANT
TUBE CONNECTING 12X1/4 (SUCTIONS) IMPLANT
WATER STERILE IRR 3000ML UROMA (IV SOLUTION) ×1 IMPLANT

## 2022-09-13 NOTE — Discharge Instructions (Addendum)
CYSTOSCOPY HOME CARE INSTRUCTIONS  Activity: Rest for the remainder of the day.  Do not drive or operate equipment today.  You may resume normal activities in one to two days as instructed by your physician.   Meals: Drink plenty of liquids and eat light foods such as gelatin or soup this evening.  You may return to a normal meal plan tomorrow.  Return to Work: You may return to work in one to two days or as instructed by your physician.  Special Instructions / Symptoms: Call your physician if any of these symptoms occur:   -persistent or heavy bleeding  -bleeding which continues after first few urination  -large blood clots that are difficult to pass  -urine stream diminishes or stops completely  -fever equal to or higher than 101 degrees Farenheit.  -cloudy urine with a strong, foul odor  -severe pain  Females should always wipe from front to back after elimination.  You may feel some burning pain when you urinate.  This should disappear with time.  Applying moist heat to the lower abdomen or a hot tub bath may help relieve the pain. \  You may remove the catheter in the morning following the attached instructions.  With the use of the medicated balloon, please avoid sexual activity for 2 weeks.    Patient Signature:  ________________________________________________________  Nurse's Signature:  ________________________________________________________   Post Anesthesia Home Care Instructions  Activity: Get plenty of rest for the remainder of the day. A responsible individual must stay with you for 24 hours following the procedure.  For the next 24 hours, DO NOT: -Drive a car -Paediatric nurse -Drink alcoholic beverages -Take any medication unless instructed by your physician -Make any legal decisions or sign important papers.  Meals: Start with liquid foods such as gelatin or soup. Progress to regular foods as tolerated. Avoid greasy, spicy, heavy foods. If nausea  and/or vomiting occur, drink only clear liquids until the nausea and/or vomiting subsides. Call your physician if vomiting continues.  Special Instructions/Symptoms: Your throat may feel dry or sore from the anesthesia or the breathing tube placed in your throat during surgery. If this causes discomfort, gargle with warm salt water. The discomfort should disappear within 24 hours.

## 2022-09-13 NOTE — Anesthesia Preprocedure Evaluation (Addendum)
Anesthesia Evaluation  Patient identified by MRN, date of birth, ID band Patient awake    Reviewed: Allergy & Precautions, NPO status , Patient's Chart, lab work & pertinent test results  Airway Mallampati: I       Dental no notable dental hx.    Pulmonary neg pulmonary ROS   Pulmonary exam normal        Cardiovascular negative cardio ROS Normal cardiovascular exam     Neuro/Psych negative neurological ROS  negative psych ROS   GI/Hepatic negative GI ROS, Neg liver ROS,,,  Endo/Other  negative endocrine ROS    Renal/GU negative Renal ROS  negative genitourinary   Musculoskeletal negative musculoskeletal ROS (+)    Abdominal Normal abdominal exam  (+)   Peds  Hematology   Anesthesia Other Findings   Reproductive/Obstetrics negative OB ROS                             Anesthesia Physical Anesthesia Plan  ASA: 1  Anesthesia Plan: General   Post-op Pain Management:    Induction: Intravenous  PONV Risk Score and Plan: Ondansetron, Dexamethasone and Midazolam  Airway Management Planned: LMA  Additional Equipment: None  Intra-op Plan:   Post-operative Plan: Extubation in OR  Informed Consent: I have reviewed the patients History and Physical, chart, labs and discussed the procedure including the risks, benefits and alternatives for the proposed anesthesia with the patient or authorized representative who has indicated his/her understanding and acceptance.     Dental advisory given  Plan Discussed with: CRNA  Anesthesia Plan Comments:        Anesthesia Quick Evaluation

## 2022-09-13 NOTE — Anesthesia Postprocedure Evaluation (Signed)
Anesthesia Post Note  Patient: Nicole Burgess  Procedure(s) Performed: CYSTOSCOPY WITH URETHRAL DILATATION, OPTILUM, (Urethra) CYSTOSCOPY/HYDRODISTENSION AND INSTILL MARCAINE AND PYRIDIUM (Urethra)     Patient location during evaluation: PACU Anesthesia Type: General Level of consciousness: awake and sedated Pain management: pain level controlled Vital Signs Assessment: post-procedure vital signs reviewed and stable Respiratory status: spontaneous breathing Cardiovascular status: stable Postop Assessment: no apparent nausea or vomiting Anesthetic complications: no  No notable events documented.  Last Vitals:  Vitals:   09/13/22 0830 09/13/22 0845  BP: 125/63 (!) 102/44  Pulse: 78 (!) 55  Resp: 13 13  Temp:    SpO2: 100% 97%    Last Pain:  Vitals:   09/13/22 0845  TempSrc:   PainSc: [name]

## 2022-09-13 NOTE — Anesthesia Procedure Notes (Signed)
Procedure Name: LMA Insertion Date/Time: 09/13/2022 7:28 AM  Performed by: Clearnce Sorrel, CRNAPre-anesthesia Checklist: Patient identified, Emergency Drugs available, Suction available and Patient being monitored Patient Re-evaluated:Patient Re-evaluated prior to induction Oxygen Delivery Method: Circle System Utilized Preoxygenation: Pre-oxygenation with 100% oxygen Induction Type: IV induction Ventilation: Mask ventilation without difficulty LMA: LMA inserted LMA Size: 4.0 Number of attempts: 1 Airway Equipment and Method: Bite block Placement Confirmation: positive ETCO2 Tube secured with: Tape Dental Injury: Teeth and Oropharynx as per pre-operative assessment

## 2022-09-13 NOTE — Transfer of Care (Signed)
Immediate Anesthesia Transfer of Care Note  Patient: Nicole Burgess  Procedure(s) Performed: CYSTOSCOPY WITH URETHRAL DILATATION, OPTILUM, (Urethra) CYSTOSCOPY/HYDRODISTENSION AND INSTILL MARCAINE AND PYRIDIUM (Urethra)  Patient Location: PACU  Anesthesia Type:General  Level of Consciousness: drowsy and patient cooperative  Airway & Oxygen Therapy: Patient Spontanous Breathing  Post-op Assessment: Report given to RN and Post -op Vital signs reviewed and stable  Post vital signs: Reviewed and stable  Last Vitals:  Vitals Value Taken Time  BP 100/51 09/13/22 0818  Temp    Pulse 59 09/13/22 0820  Resp 11 09/13/22 0820  SpO2 99 % 09/13/22 0820  Vitals shown include unvalidated device data.  Last Pain:  Vitals:   09/13/22 0555  TempSrc: Oral  PainSc: 0-No pain      Patients Stated Pain Goal: 5 ([id number])  Complications: No notable events documented.

## 2022-09-13 NOTE — Op Note (Signed)
Procedure: 1.  Cystoscopy with hydrodistention of the bladder with instillation of Pyridium and Marcaine. 2.  Optilume balloon dilation of urethral stricture. 3.  Application of fluoroscopy.  Preop diagnosis: 1.  Urethral stricture. 2.  Interstitial cystitis.  Postop diagnosis: Same.  Surgeon: Dr. Irine Seal.  Anesthesia: General.  Specimen: None.  EBL: None.  Complications: None.  Indications: The patient is a 37 year old female with a history of interstitial cystitis and a urethral stricture who was previously required urethral dilation and hydrodistention for management.  She has severe recurrent symptoms and is to undergo dilation with possible Optilume balloon application and hydrodistention of the bladder.  Procedure: She was taken the operating room where she was given Ancef.  General anesthetic was induced.  She was placed in lithotomy position and fitted with PAS hose.  Her perineum and genitalia were prepped with Betadine solution and she was draped in usual sterile fashion.  Initial attempt at cystoscopy with 21 French scope was unsuccessful due to her severe stricture.  I attempted to probe with female sounds but was unable to get the tip of a 14 [place] sound to pass.  I then used a sensor wire and was able to locate the true lumen but was unable to pass a 4 French semirigid ureteroscope along with the wire.  I then used a 15 cm 24 French high-pressure balloon to dilate the urethral stricture under fluoroscopic guidance.  The balloon passed with some resistance and was inflated to 20 atm with disappearance of the waist.  The balloon was left inflated for a minute and then deflated.  Female sounds from 26-28 [place] were then passed for further dilation.  The cystoscope was then passed successfully and the disrupted stricture in the mid urethra was identified and visualized.  Inspection of the bladder revealed mild to moderate trabeculation without mucosal lesions.  Ureteral  orifices were unremarkable.  The bladder was then dilated under 80 cm of water pressure to capacity and then drained.  Her capacity under anesthesia was 600 mL.  Cystoscopy after hydrodistention demonstrated a few glomerulations consistent with interstitial cystitis and mucosal erythema.  No bladder wall injuries, ulcers or mucosal tears were noted.  The guidewire was then replaced and the Optilume 3 cm x 30 French balloon was passed over the wire through the cystoscope and was allowed to hydrate for 1 minute before it was passed across the stricture and inflated to 10 atm.  The position of the balloon was monitored fluoroscopically with contrast in the bladder.  The balloon was left inflated for 5 minutes before removal.  A 14 French Foley catheter was then inserted and the bladder was drained.  The balloon was filled with 2 mL of sterile fluid.  The bladder was then instilled with 15 mL of half percent Marcaine with 400 mg of crushed Pyridium and the catheter was plugged for 10 minutes before being placed to straight drainage.  She was taken down from lithotomy position, her anesthetic was reversed and she was moved to recovery in stable condition.  There were no complications.

## 2022-09-13 NOTE — Interval H&P Note (Signed)
History and Physical Interval Note:  09/13/2022 7:17 AM  Nicole Burgess  has presented today for surgery, with the diagnosis of [address].  The various methods of treatment have been discussed with the patient and family. After consideration of risks, benefits and other options for treatment, the patient has consented to  Procedure(s): CYSTOSCOPY WITH URETHRAL DILATATION, OPTILUM, (N/A) CYSTOSCOPY/HYDRODISTENSION AND INSTILL MARCAINE AND PYRIDIUM (N/A) as a surgical intervention.  The patient's history has been reviewed, patient examined, no change in status, stable for surgery.  I have reviewed the patient's chart and labs.  Questions were answered to the patient's satisfaction.     Irine Seal

## 2022-09-16 ENCOUNTER — Encounter (HOSPITAL_BASED_OUTPATIENT_CLINIC_OR_DEPARTMENT_OTHER): Payer: Self-pay | Admitting: Urology

## 2023-04-02 ENCOUNTER — Other Ambulatory Visit: Payer: Self-pay | Admitting: Urology

## 2023-04-03 ENCOUNTER — Encounter (HOSPITAL_COMMUNITY): Payer: Self-pay

## 2023-04-03 ENCOUNTER — Other Ambulatory Visit: Payer: Self-pay | Admitting: Urology

## 2023-04-03 NOTE — Progress Notes (Addendum)
For Anesthesia: PCP - [facility] Rolanda Lundborg NP Cardiologist -  N/A  Chest x-ray - greater than 1 year in [facility] EKG - N/A Stress Test - N/A ECHO - N/A Cardiac Cath - N/A Pacemaker/ICD device last checked: N/A Pacemaker orders received: N/A Device Rep notified: N/A  Spinal Cord Stimulator:N/A  Sleep Study - N/A CPAP - N/A  Fasting Blood Sugar - N/A Checks Blood Sugar __N/A___ times a day Date and result of last Hgb A1c-N/A  Last dose of GLP1 agonist- N/A GLP1 instructions: N/A  Last dose of SGLT-2 inhibitors- N/A SGLT-2 instructions: N/A  Blood Thinner Instructions: N/A Aspirin Instructions: N/A Last Dose: N/A  Activity level:  Able to exercise without chest pain and/or shortness of breath       Anesthesia review: N/A  Patient denies shortness of breath, fever, cough and chest pain at PAT appointment   Patient verbalized understanding of instructions that were given to them at the PAT appointment. Patient was also instructed that they will need to review over the PAT instructions again at home before surgery.

## 2023-04-04 ENCOUNTER — Encounter (HOSPITAL_COMMUNITY): Payer: Self-pay

## 2023-04-04 ENCOUNTER — Other Ambulatory Visit: Payer: Self-pay

## 2023-04-04 ENCOUNTER — Encounter (HOSPITAL_COMMUNITY)
Admission: RE | Admit: 2023-04-04 | Discharge: 2023-04-04 | Disposition: A | Discharge status: Home / Self Care | Payer: BC Managed Care – PPO | Source: Ambulatory Visit | Attending: Urology | Admitting: Urology

## 2023-04-04 VITALS — Ht 66.0 in | Wt 200.0 lb

## 2023-04-04 DIAGNOSIS — Z01812 Encounter for preprocedural laboratory examination: Secondary | ICD-10-CM | POA: Diagnosis not present

## 2023-04-04 DIAGNOSIS — Z01818 Encounter for other preprocedural examination: Secondary | ICD-10-CM | POA: Diagnosis present

## 2023-04-04 HISTORY — DX: Rheumatoid arthritis, unspecified: M06.9

## 2023-04-09 NOTE — H&P (Signed)
CC: painful voiding.   Hx: Nicole Burgess is a 37 yo female who was last seen about 10 years ago for IC by Dr. Patsi Sears. She had cystoscopy and HOD at that time. 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 The last was in 2018 by Dr. Achilles Dunk in [place]. She had a 26fr stricture. She will get relief of the pain with the dilations but now she is just dealing with it. She gets pain with a full bladder and pain with voiding. The bladder pain is intermittent and related to diet. She has variable frequency and nocturia. Her stream is weak. She used to get UTI's but not recently. She has had no stones. She had UDS previously that showed a bladder with early sensation. She has some urgency but not much incontinence.   09/27/2022:  Patient presents with her husband at her side for 3 week follow-up status post Optilume dilation, HOD on 3/8. He is on no medications by urology at this time. Since procedure, patient endorses resolution of urethral pain. She is voiding with a good force of stream, and is pleased with the outcome. She is anxious as prior dilation procedures have only provided relief for 1-2 months before recurrence. She denies dysuria, gross hematuria, suprapubic discomfort, flank pain, fevers/chills, nausea/vomiting.   04/01/23: Nicole Burgess returns today in f/u. She has a history of a urethral stricture and had an Optilume dilation and HOD on 09/13/22. She is doing well but she reports the pain has been coming back for about 2 months and she can have some urgency with UUI. She reports some decline in the stream but it is not back to the prior state and she is emptying well with a PVR of 27ml. She pain when she voiding with dysuria. She has no malodorous urine or hematuria.     ALLERGIES: No Allergies    MEDICATIONS: Depakote 500 mg tablet, delayed release  Pristiq 100 mg tablet, extended release 24 hr  Vraylar     GU PSH:  Cystoscopy Hydrodistention - 09/13/2022       PSH Notes: Cesarean Section,  tubal ligation  Cystoscopy with HOD  Urethral dilation x 2.    NON-GU PSH: Cesarean Delivery Only - 2010     GU PMH: Anterior urethral stricture - 09/27/2022, She has a history of a urethral stricture with chronic voiding pain and has a reduced stream and an elevated PVR today. I discussed options and with the IC and bladder pain, I will get her set up to go to the OR for cystoscopy with urethral dilation with optilume balloon application, HOD and instillation of P& M. I have reviewed the risks of bleeding, infection, urethral and bladder injury, incotinence, recurrent stricture, need for secondary procedure, thrombotic events and anesthetic complications as well as precautions with conception related to the drug coated balloon. , - 08/09/2022 Dysuria - 09/27/2022, (Stable), - 08/09/2022, Dysuria, - 2014 Incomplete bladder emptying - 09/27/2022, - 08/09/2022 Interstitial Cystitis (w/o hematuria) (Stable) - 08/09/2022, Chronic interstitial cystitis without hematuria, - 2014 Weak Urinary Stream - 08/09/2022 Personal Hx Oth Urinary System diseases, History of chronic cystitis - 2014      PMH Notes:  1898-07-08 00:00:00 - Note: Normal Routine History And Physical Adult   NON-GU PMH: Anxiety Arthritis    FAMILY HISTORY: Family Health Status - Father alive at age 2 - Runs In Family Family Health Status - Mother's Age -  Runs In [facility] Family Health Status Number - Father Hyperthyroidism - Mother melanoma - Grandmother nephrolithiasis - Father   SOCIAL HISTORY: Marital Status: Married Preferred Language: English; Ethnicity: Not Hispanic Or Latino; Race: White Current Smoking Status: Patient has never smoked.   Tobacco Use Assessment Completed: Used Tobacco in last 30 days? Does not use smokeless tobacco. Has never drank.  Does not use drugs. Drinks 2 caffeinated drinks per day. 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     Notes: Caffeine Use, Marital History - Currently Married, Alcohol Use, Tobacco Use, Occupation:   REVIEW OF SYSTEMS:    GU Review Female:   Patient reports burning /pain with urination, get up at night to urinate, and leakage of urine. Patient denies frequent urination, hard to postpone urination, stream starts and stops, trouble starting your stream, have to strain to urinate, and being pregnant.  Gastrointestinal (Upper):   Patient denies nausea, vomiting, and indigestion/ heartburn.  Gastrointestinal (Lower):   Patient denies diarrhea and constipation.  Constitutional:   Patient denies fever, night sweats, weight loss, and fatigue.  Skin:   Patient denies skin rash/ lesion and itching.  Eyes:   Patient denies blurred vision and double vision.  Ears/ Nose/ Throat:   Patient denies sore throat and sinus problems.  Hematologic/Lymphatic:   Patient denies swollen glands and easy bruising.  Cardiovascular:   Patient denies leg swelling and chest pains.  Respiratory:   Patient denies cough and shortness of breath.  Endocrine:   Patient denies excessive thirst.  Musculoskeletal:   Patient denies back pain and joint pain.  Neurological:   Patient denies headaches and dizziness.  Psychologic:   Patient denies anxiety and depression.   Notes: Urgency    VITAL SIGNS: None   MULTI-SYSTEM PHYSICAL EXAMINATION:    Constitutional: Well-nourished. No physical deformities. Normally developed. Good grooming.  Respiratory: Normal breath sounds. No labored breathing, no use of accessory muscles.   Cardiovascular: Regular rate and rhythm. No murmur, no gallop.      Complexity of Data:  Records Review:   Previous Patient Records  Urine Test Review:   Urinalysis  Urodynamics Review:   Review Bladder Scan   PROCEDURES:         PVR Ultrasound - 16109  Scanned Volume: 27 cc         Visit Complexity - G2211 Chronic management         Urinalysis  w/Scope Dipstick Dipstick Cont'd Micro  Color: Yellow Bilirubin: Neg mg/dL WBC/hpf: 0 - 5/hpf  Appearance: Clear Ketones: Neg mg/dL RBC/hpf: 3 - 60/AVW  Specific Gravity: 1.015 Blood: 1+ ery/uL Bacteria: Mod (26-50/hpf)  pH: 7.5 Protein: Trace mg/dL Cystals: NS (Not Seen)  Glucose: Neg mg/dL Urobilinogen: 0.2 mg/dL Casts: NS (Not Seen)    Nitrites: Neg Trichomonas: Not Present    Leukocyte Esterase: Trace leu/uL Mucous: Present      Epithelial Cells: 6 - 10/hpf      Yeast: NS (Not Seen)      Sperm: Not Present    ASSESSMENT:      ICD-10 Details  1 GU:   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 She may also have a UTI so I will culture and get her started on antibiotics. I will get her scheduled for the procedure. She is aware  of the risks. If her symptoms resolve with the antibiotics, she will let me know and we can cancel the surgery.   2   Weak Urinary Stream - R39.12 Chronic, Worsening  3   Dysuria - R30.0 Chronic, Worsening - culture today. Macrobid started.   4   Interstitial Cystitis (w/o hematuria) - N30.10 Chronic, Worsening   PLAN:            Medications New Meds: Macrobid 100 mg capsule 1 capsule PO BID   #14  0 Refill(s)  Pharmacy Name:  [id number]  Address:  [address]   [place], [place] [id number]  Phone:  [phone]  Fax:  [phone]            Orders Labs Urine Culture          Schedule Return Visit/Planned Activity: Next Available Appointment - Schedule Surgery  Procedure: Unspecified Date - Cysto Dilate Female Doneta Public - [id number] Notes: Next available.

## 2023-04-11 ENCOUNTER — Ambulatory Visit (HOSPITAL_COMMUNITY): Payer: BC Managed Care – PPO | Admitting: Certified Registered"

## 2023-04-11 ENCOUNTER — Ambulatory Visit (HOSPITAL_COMMUNITY)
Admission: RE | Admit: 2023-04-11 | Discharge: 2023-04-11 | Disposition: A | Discharge status: Home / Self Care | Payer: BC Managed Care – PPO | Attending: Urology | Admitting: Urology

## 2023-04-11 ENCOUNTER — Ambulatory Visit (HOSPITAL_COMMUNITY): Payer: BC Managed Care – PPO

## 2023-04-11 ENCOUNTER — Other Ambulatory Visit: Payer: Self-pay

## 2023-04-11 ENCOUNTER — Encounter (HOSPITAL_COMMUNITY): Payer: Self-pay | Admitting: Urology

## 2023-04-11 ENCOUNTER — Encounter (HOSPITAL_COMMUNITY)
Admission: RE | Disposition: A | Discharge status: Home / Self Care | Payer: Self-pay | Source: Home / Self Care | Attending: Urology

## 2023-04-11 DIAGNOSIS — Z87440 Personal history of urinary (tract) infections: Secondary | ICD-10-CM | POA: Diagnosis not present

## 2023-04-11 DIAGNOSIS — M069 Rheumatoid arthritis, unspecified: Secondary | ICD-10-CM | POA: Diagnosis not present

## 2023-04-11 DIAGNOSIS — N3592 Unspecified urethral stricture, female: Secondary | ICD-10-CM | POA: Insufficient documentation

## 2023-04-11 DIAGNOSIS — R3915 Urgency of urination: Secondary | ICD-10-CM | POA: Insufficient documentation

## 2023-04-11 DIAGNOSIS — R3912 Poor urinary stream: Secondary | ICD-10-CM | POA: Diagnosis not present

## 2023-04-11 DIAGNOSIS — N3030 Trigonitis without hematuria: Secondary | ICD-10-CM | POA: Diagnosis not present

## 2023-04-11 DIAGNOSIS — R300 Dysuria: Secondary | ICD-10-CM | POA: Diagnosis not present

## 2023-04-11 DIAGNOSIS — Z01818 Encounter for other preprocedural examination: Secondary | ICD-10-CM

## 2023-04-11 HISTORY — PX: CYSTOSCOPY WITH URETHRAL DILATATION: SHX5125

## 2023-04-11 HISTORY — PX: CYSTO WITH HYDRODISTENSION: SHX5453

## 2023-04-11 LAB — POCT PREGNANCY, URINE: Preg Test, Ur: NEGATIVE

## 2023-04-11 SURGERY — CYSTOSCOPY, WITH URETHRAL DILATION
Anesthesia: General | Site: Bladder

## 2023-04-11 MED ORDER — IOHEXOL 300 MG/ML  SOLN
INTRAMUSCULAR | Status: DC | PRN
  Administered 2023-04-11: 10 mL

## 2023-04-11 MED ORDER — CEFAZOLIN SODIUM-DEXTROSE 2-4 GM/100ML-% IV SOLN
2.0000 g | INTRAVENOUS | Status: AC
  Administered 2023-04-11: 2 g via INTRAVENOUS
  Filled 2023-04-11: qty 100

## 2023-04-11 MED ORDER — ACETAMINOPHEN 325 MG PO TABS: 650.0000 mg | ORAL_TABLET | ORAL | Status: DC | PRN

## 2023-04-11 MED ORDER — PROPOFOL 10 MG/ML IV BOLUS
INTRAVENOUS | Status: AC
  Filled 2023-04-11: qty 20

## 2023-04-11 MED ORDER — MIDAZOLAM HCL 5 MG/5ML IJ SOLN
INTRAMUSCULAR | Status: DC | PRN
  Administered 2023-04-11: 2 mg via INTRAVENOUS

## 2023-04-11 MED ORDER — FENTANYL CITRATE (PF) 100 MCG/2ML IJ SOLN
INTRAMUSCULAR | Status: AC
  Filled 2023-04-11: qty 2

## 2023-04-11 MED ORDER — PHENYLEPHRINE 80 MCG/ML (10ML) SYRINGE FOR IV PUSH (FOR BLOOD PRESSURE SUPPORT)
PREFILLED_SYRINGE | INTRAVENOUS | Status: AC
  Filled 2023-04-11: qty 10

## 2023-04-11 MED ORDER — AMISULPRIDE (ANTIEMETIC) 5 MG/2ML IV SOLN: 10.0000 mg | Freq: Once | INTRAVENOUS | Status: DC | PRN

## 2023-04-11 MED ORDER — ORAL CARE MOUTH RINSE: 15.0000 mL | Freq: Once | OROMUCOSAL | Status: AC

## 2023-04-11 MED ORDER — MIDAZOLAM HCL 2 MG/2ML IJ SOLN
INTRAMUSCULAR | Status: AC
  Filled 2023-04-11: qty 2

## 2023-04-11 MED ORDER — CEPHALEXIN 500 MG PO CAPS: 500.0000 mg | ORAL_CAPSULE | Freq: Three times a day (TID) | ORAL | 0 refills | Status: AC

## 2023-04-11 MED ORDER — SODIUM CHLORIDE 0.9% FLUSH: 3.0000 mL | Freq: Two times a day (BID) | INTRAVENOUS | Status: DC

## 2023-04-11 MED ORDER — LACTATED RINGERS IV SOLN: INTRAVENOUS | Status: DC | PRN

## 2023-04-11 MED ORDER — ACETAMINOPHEN 650 MG RE SUPP: 650.0000 mg | RECTAL | Status: DC | PRN

## 2023-04-11 MED ORDER — HYDROCODONE-ACETAMINOPHEN 5-325 MG PO TABS: 1.0000 | ORAL_TABLET | Freq: Four times a day (QID) | ORAL | 0 refills | Status: DC | PRN

## 2023-04-11 MED ORDER — LIDOCAINE HCL (CARDIAC) PF 100 MG/5ML IV SOSY
PREFILLED_SYRINGE | INTRAVENOUS | Status: DC | PRN
  Administered 2023-04-11: 80 mg via INTRAVENOUS

## 2023-04-11 MED ORDER — STERILE WATER FOR IRRIGATION IR SOLN
Status: DC | PRN
  Administered 2023-04-11: 6000 mL

## 2023-04-11 MED ORDER — ACETAMINOPHEN 10 MG/ML IV SOLN
1000.0000 mg | Freq: Once | INTRAVENOUS | Status: DC | PRN
  Administered 2023-04-11: 1000 mg via INTRAVENOUS

## 2023-04-11 MED ORDER — OXYCODONE HCL 5 MG PO TABS
5.0000 mg | ORAL_TABLET | ORAL | Status: DC | PRN
  Administered 2023-04-11: 10 mg via ORAL

## 2023-04-11 MED ORDER — OXYCODONE HCL 5 MG PO TABS
ORAL_TABLET | ORAL | Status: AC
  Filled 2023-04-11: qty 2

## 2023-04-11 MED ORDER — PROPOFOL 10 MG/ML IV BOLUS
INTRAVENOUS | Status: DC | PRN
  Administered 2023-04-11: 150 mg via INTRAVENOUS
  Administered 2023-04-11: 50 mg via INTRAVENOUS

## 2023-04-11 MED ORDER — FENTANYL CITRATE (PF) 100 MCG/2ML IJ SOLN
INTRAMUSCULAR | Status: DC | PRN
  Administered 2023-04-11 (×2): 50 ug via INTRAVENOUS

## 2023-04-11 MED ORDER — SODIUM CHLORIDE 0.9 % IV SOLN: 250.0000 mL | INTRAVENOUS | Status: DC | PRN

## 2023-04-11 MED ORDER — FENTANYL CITRATE PF 50 MCG/ML IJ SOSY
25.0000 ug | PREFILLED_SYRINGE | INTRAMUSCULAR | Status: DC | PRN
  Administered 2023-04-11: 50 ug via INTRAVENOUS

## 2023-04-11 MED ORDER — ACETAMINOPHEN 10 MG/ML IV SOLN
INTRAVENOUS | Status: AC
  Filled 2023-04-11: qty 100

## 2023-04-11 MED ORDER — MORPHINE SULFATE (PF) 2 MG/ML IV SOLN: 2.0000 mg | INTRAVENOUS | Status: DC | PRN

## 2023-04-11 MED ORDER — ONDANSETRON HCL 4 MG/2ML IJ SOLN
INTRAMUSCULAR | Status: AC
  Filled 2023-04-11: qty 2

## 2023-04-11 MED ORDER — CHLORHEXIDINE GLUCONATE 0.12 % MT SOLN
15.0000 mL | Freq: Once | OROMUCOSAL | Status: AC
  Administered 2023-04-11: 15 mL via OROMUCOSAL

## 2023-04-11 MED ORDER — DEXAMETHASONE SODIUM PHOSPHATE 10 MG/ML IJ SOLN
INTRAMUSCULAR | Status: AC
  Filled 2023-04-11: qty 1

## 2023-04-11 MED ORDER — LACTATED RINGERS IV SOLN: INTRAVENOUS | Status: DC

## 2023-04-11 MED ORDER — FENTANYL CITRATE PF 50 MCG/ML IJ SOSY
PREFILLED_SYRINGE | INTRAMUSCULAR | Status: AC
  Filled 2023-04-11: qty 2

## 2023-04-11 MED ORDER — SODIUM CHLORIDE 0.9% FLUSH: 3.0000 mL | INTRAVENOUS | Status: DC | PRN

## 2023-04-11 SURGICAL SUPPLY — 26 items
BAG URO CATCHER STRL LF (MISCELLANEOUS) ×2 IMPLANT
BALLN CATH NEPHR ULTRAXX 8X125 (BALLOONS) ×2
BALLN NEPHROSTOMY (BALLOONS)
BALLOON NEPHROSTOMY (BALLOONS) IMPLANT
CATH BALLOON NEPH ULTRXX 8X125 (BALLOONS) IMPLANT
CATH FOLEY 2WAY 5CC 20FR (CATHETERS) ×2 IMPLANT
CATH ROBINSON RED A/P 16FR (CATHETERS) ×2 IMPLANT
CATH ROBINSON RED A/P 18FR (CATHETERS) IMPLANT
CATH URETL OPEN 5X70 (CATHETERS) IMPLANT
CLOTH BEACON ORANGE TIMEOUT ST (SAFETY) ×2 IMPLANT
GLOVE SURG SS PI 8.0 STRL IVOR (GLOVE) IMPLANT
GOWN STRL REUS W/ TWL XL LVL3 (GOWN DISPOSABLE) ×2 IMPLANT
GOWN STRL REUS W/TWL XL LVL3 (GOWN DISPOSABLE) ×2
GUIDEWIRE STR DUAL SENSOR (WIRE) ×2 IMPLANT
KIT TURNOVER KIT A (KITS) IMPLANT
MANIFOLD NEPTUNE II (INSTRUMENTS) ×2 IMPLANT
NDL HYPO 22X1.5 SAFETY MO (MISCELLANEOUS) IMPLANT
NDL SAFETY ECLIP 18X1.5 (MISCELLANEOUS) IMPLANT
NEEDLE HYPO 22X1.5 SAFETY MO (MISCELLANEOUS)
PACK CYSTO (CUSTOM PROCEDURE TRAY) ×2 IMPLANT
PAD PREP 24X48 CUFFED NSTRL (MISCELLANEOUS) ×2 IMPLANT
PENCIL SMOKE EVACUATOR (MISCELLANEOUS) IMPLANT
PLUG CATH AND CAP STRL 200 (CATHETERS) ×2 IMPLANT
SYR BULB IRRIG 60ML STRL (SYRINGE) IMPLANT
TUBING CONNECTING 10 (TUBING) ×2 IMPLANT
WATER STERILE IRR 1000ML POUR (IV SOLUTION) IMPLANT

## 2023-04-11 NOTE — Discharge Instructions (Signed)

## 2023-04-11 NOTE — Transfer of Care (Signed)
Immediate Anesthesia Transfer of Care Note  Patient: Nicole Burgess  Procedure(s) Performed: CYSTOSCOPY WITH URETHRAL DILATATION CYSTOSCOPY/HYDRODISTENSION OF BLADDER/URETHRAL DILATION (Bladder)  Patient Location: PACU  Anesthesia Type:General  Level of Consciousness: sedated  Airway & Oxygen Therapy: Patient Spontanous Breathing and Patient connected to face mask oxygen  Post-op Assessment: Report given to RN and Post -op Vital signs reviewed and stable  Post vital signs: Reviewed and stable  Last Vitals:  Vitals Value Taken Time  BP 101/62 04/11/23 1109  Temp    Pulse 47 04/11/23 1111  Resp 9 04/11/23 1111  SpO2 100 % 04/11/23 1111  Vitals shown include unfiled device data.  Last Pain:  Vitals:   04/11/23 0858  TempSrc:   PainSc: 0-No pain      Patients Stated Pain Goal: 5 (04/11/23 0858)  Complications: No notable events documented.

## 2023-04-11 NOTE — Interval H&P Note (Signed)
History and Physical Interval Note:  04/11/2023 10:30 AM  Nicole Burgess  has presented today for surgery, with the diagnosis of URETHRAL STRICTURE AND INTERSTITIAL CYSTITIS.  The various methods of treatment have been discussed with the patient and family. After consideration of risks, benefits and other options for treatment, the patient has consented to  Procedure(s): CYSTOSCOPY WITH URETHRAL DILATATION (N/A) CYSTOSCOPY/HYDRODISTENSION OF BLADDER (N/A) as a surgical intervention.  The patient's history has been reviewed, patient examined, no change in status, stable for surgery.  I have reviewed the patient's chart and labs.  Questions were answered to the patient's satisfaction.     Bjorn Pippin

## 2023-04-11 NOTE — Op Note (Signed)
Procedure: 1.  Cystoscopy with dilation of urethral stricture. 2.  Cystoscopy with hydrodistention of the bladder. 3.  Application of fluoroscopy.  Preop diagnosis: Recurrent urethral stricture with interstitial cystitis.  Postop diagnosis: Same with follicular cystitis.  Surgeon: Dr. Bjorn Pippin.  Anesthesia: General.  Specimen: None.  Complications: None.  Drains: None.  EBL: None.  Indications: The patient is a 37 year old female with a history of interstitial cystitis and recurrent urethral strictures who had previously undergone an Optilume balloon dilation of the urethral stricture approximately 6 months ago with initial good response but she has had recurrent symptoms and needs another dilation.  We were not able to get approval for the Optilume so she will have a standard dilation.  Procedure: She was taken operating room where she was given Ancef.  A general anesthetic was induced.  She was placed in lithotomy position and fitted with PAS hose.  Her perineum and genitalia were prepped with Betadine solution and she was draped in usual sterile fashion.  An initial attempt at passing the cystoscope was unsuccessful due to recurrent stricture which was quite tight.  A sensor wire was then advanced through the meatus into the bladder under fluoroscopic guidance.  A UroMax 15 cm x 24 French high-pressure balloon was then passed across the stricture and inflated to 20 atm under fluoroscopic guidance.  The balloon was fully inflated without a waist under fluoroscopy.  After a minute the catheter was deflated and removed.  The cystoscope was then reinserted and inspection of bladder revealed mild trabeculation with some studding of the mucosa with lesions consistent with follicular cystitis.  There was some erythema on the posterior wall but it appeared to be from the balloon passage and wire.  The ureteral orifices were unremarkable.  The bladder was then filled to capacity under 80 cm of  water pressure and then drained.  Her capacity was 600 mL.  There was some glomerulations noted on the mucosa consistent with the interstitial cystitis after dilation.  At this point I further dilated the urethra up to 107 [place] with female sounds and drain the bladder.  She was taken down from lithotomy position, her anesthetic was reversed and she was moved recovery in stable condition.  There were no complications.

## 2023-04-11 NOTE — Interval H&P Note (Signed)
History and Physical Interval Note:  04/11/2023 8:51 AM  Nicole Burgess  has presented today for surgery, with the diagnosis of URETHRAL STRICTURE AND INTERSTITIAL CYSTITIS.  The various methods of treatment have been discussed with the patient and family. After consideration of risks, benefits and other options for treatment, the patient has consented to  Procedure(s): CYSTOSCOPY WITH URETHRAL DILATATION (N/A) CYSTOSCOPY/HYDRODISTENSION OF BLADDER (N/A) as a surgical intervention.  The patient's history has been reviewed, patient examined, no change in status, stable for surgery.  I have reviewed the patient's chart and labs.  Questions were answered to the patient's satisfaction.     Bjorn Pippin

## 2023-04-11 NOTE — Anesthesia Procedure Notes (Signed)
Procedure Name: LMA Insertion Date/Time: 04/11/2023 10:51 AM  Performed by: Deri Fuelling, CRNAPre-anesthesia Checklist: Patient identified, Emergency Drugs available, Suction available and Patient being monitored Patient Re-evaluated:Patient Re-evaluated prior to induction Oxygen Delivery Method: Circle system utilized Preoxygenation: Pre-oxygenation with 100% oxygen Induction Type: IV induction Ventilation: Mask ventilation without difficulty LMA: LMA inserted LMA Size: 4.0 Tube type: Oral Number of attempts: 1 Airway Equipment and Method: Stylet and Oral airway Placement Confirmation: ETT inserted through vocal cords under direct vision, positive ETCO2 and breath sounds checked- equal and bilateral Tube secured with: Tape Dental Injury: Teeth and Oropharynx as per pre-operative assessment

## 2023-04-11 NOTE — Anesthesia Postprocedure Evaluation (Signed)
Anesthesia Post Note  Patient: Nicole Burgess  Procedure(s) Performed: CYSTOSCOPY WITH URETHRAL DILATATION CYSTOSCOPY/HYDRODISTENSION OF BLADDER/URETHRAL DILATION (Bladder)     Patient location during evaluation: PACU Anesthesia Type: General Level of consciousness: awake and alert Pain management: pain level controlled Vital Signs Assessment: post-procedure vital signs reviewed and stable Respiratory status: spontaneous breathing, nonlabored ventilation, respiratory function stable and patient connected to nasal cannula oxygen Cardiovascular status: blood pressure returned to baseline and stable Postop Assessment: no apparent nausea or vomiting Anesthetic complications: no   No notable events documented.  Last Vitals:  Vitals:   04/11/23 1115 04/11/23 1130  BP: 111/79 110/66  Pulse: (!) 56 (!) 55  Resp: 17 13  Temp:    SpO2: 100% 100%    Last Pain:  Vitals:   04/11/23 1130  TempSrc:   PainSc: 4                  [name]

## 2023-04-11 NOTE — Anesthesia Preprocedure Evaluation (Signed)
Anesthesia Evaluation  Patient identified by MRN, date of birth, ID band Patient awake    Reviewed: Allergy & Precautions, NPO status , Patient's Chart, lab work & pertinent test results  Airway Mallampati: II  TM Distance: >3 FB Neck ROM: Full    Dental no notable dental hx.    Pulmonary neg pulmonary ROS   Pulmonary exam normal        Cardiovascular negative cardio ROS  Rhythm:Regular Rate:Normal     Neuro/Psych negative neurological ROS  negative psych ROS   GI/Hepatic negative GI ROS, Neg liver ROS,,,  Endo/Other  negative endocrine ROS    Renal/GU negative Renal ROSUrethral stricture   negative genitourinary   Musculoskeletal  (+) Arthritis , Rheumatoid disorders,    Abdominal Normal abdominal exam  (+)   Peds  Hematology   Anesthesia Other Findings   Reproductive/Obstetrics                             Anesthesia Physical Anesthesia Plan  ASA: 2  Anesthesia Plan: General   Post-op Pain Management:    Induction: Intravenous  PONV Risk Score and Plan: 3 and Ondansetron, Dexamethasone, Midazolam and Treatment may vary due to age or medical condition  Airway Management Planned: Mask and LMA  Additional Equipment: None  Intra-op Plan:   Post-operative Plan: Extubation in OR  Informed Consent: I have reviewed the patients History and Physical, chart, labs and discussed the procedure including the risks, benefits and alternatives for the proposed anesthesia with the patient or authorized representative who has indicated his/her understanding and acceptance.     Dental advisory given  Plan Discussed with: CRNA  Anesthesia Plan Comments:        Anesthesia Quick Evaluation

## 2023-04-12 ENCOUNTER — Encounter (HOSPITAL_COMMUNITY): Payer: Self-pay | Admitting: Urology

## 2023-05-30 ENCOUNTER — Other Ambulatory Visit: Payer: Self-pay | Admitting: Urology

## 2023-06-03 NOTE — Patient Instructions (Signed)
SURGICAL WAITING ROOM VISITATION  Patients having surgery or a procedure may have no more than 2 support people in the waiting area - these visitors may rotate.    Children under the age of 28 must have an adult with them who is not the patient.  Due to an increase in RSV and influenza rates and associated hospitalizations, children ages 74 and under may not visit patients in [facility] hospitals.  If the patient needs to stay at the hospital during part of their recovery, the visitor guidelines for inpatient rooms apply. Pre-op nurse will coordinate an appropriate time for 1 support person to accompany patient in pre-op.  This support person may not rotate.    Please refer to the [facility] website for the visitor guidelines for Inpatients (after your surgery is over and you are in a regular room).    Your procedure is scheduled on: 06/10/23    Report to [facility] Main Entrance    Report to admitting at 7:45 AM   Call this number if you have problems the morning of surgery [phone]   Do not eat food or drink liquids :After Midnight.          If you have questions, please contact your surgeon's office.   FOLLOW BOWEL PREP AND ANY ADDITIONAL PRE OP INSTRUCTIONS YOU RECEIVED FROM YOUR SURGEON'S OFFICE!!!     Oral Hygiene is also important to reduce your risk of infection.                                    Remember - BRUSH YOUR TEETH THE MORNING OF SURGERY WITH YOUR REGULAR TOOTHPASTE  DENTURES WILL BE REMOVED PRIOR TO SURGERY PLEASE DO NOT APPLY "Poly grip" OR ADHESIVES!!!   Stop all vitamins and herbal supplements 7 days before surgery.   Take these medicines the morning of surgery with A SIP OF WATER: Pristiq, Vraylar, Depakote                              You may not have any metal on your body including hair pins, jewelry, and body piercing             Do not wear make-up, lotions, powders, perfumes, or deodorant  Do not wear nail polish including gel and  S&S, artificial/acrylic nails, or any other type of covering on natural nails including finger and toenails. If you have artificial nails, gel coating, etc. that needs to be removed by a nail salon please have this removed prior to surgery or surgery may need to be canceled/ delayed if the surgeon/ anesthesia feels like they are unable to be safely monitored.   Do not shave  48 hours prior to surgery.    Do not bring valuables to the hospital. [facility] IS NOT             RESPONSIBLE   FOR VALUABLES.   Contacts, glasses, dentures or bridgework may not be worn into surgery.  DO NOT BRING YOUR HOME MEDICATIONS TO THE HOSPITAL. PHARMACY WILL DISPENSE MEDICATIONS LISTED ON YOUR MEDICATION LIST TO YOU DURING YOUR ADMISSION IN THE HOSPITAL!    Patients discharged on the day of surgery will not be allowed to drive home.  Someone NEEDS to stay with you for the first 24 hours after anesthesia.  Please read over the following fact sheets you were given: IF YOU HAVE QUESTIONS ABOUT YOUR PRE-OP INSTRUCTIONS PLEASE CALL [name]    If you received a COVID test during your pre-op visit  it is requested that you wear a mask when out in public, stay away from anyone that may not be feeling well and notify your surgeon if you develop symptoms. If you test positive for Covid or have been in contact with anyone that has tested positive in the last 10 days please notify you surgeon.    Nicole Burgess - Preparing for Surgery Before surgery, you can play an important role.  Because skin is not sterile, your skin needs to be as free of germs as possible.  You can reduce the number of germs on your skin by washing with CHG (chlorahexidine gluconate) soap before surgery.  CHG is an antiseptic cleaner which kills germs and bonds with the skin to continue killing germs even after washing. Please DO NOT use if you have an allergy to CHG or antibacterial soaps.  If your skin becomes reddened/irritated  stop using the CHG and inform your nurse when you arrive at Short Stay. Do not shave (including legs and underarms) for at least 48 hours prior to the first CHG shower.  You may shave your face/neck.  Please follow these instructions carefully:  1.  Shower with CHG Soap the night before surgery and the  morning of surgery.  2.  If you choose to wash your hair, wash your hair first as usual with your normal  shampoo.  3.  After you shampoo, rinse your hair and body thoroughly to remove the shampoo.                             4.  Use CHG as you would any other liquid soap.  You can apply chg directly to the skin and wash.  Gently with a scrungie or clean washcloth.  5.  Apply the CHG Soap to your body ONLY FROM THE NECK DOWN.   Do   not use on face/ open                           Wound or open sores. Avoid contact with eyes, ears mouth and   genitals (private parts).                       Wash face,  Genitals (private parts) with your normal soap.             6.  Wash thoroughly, paying special attention to the area where your    surgery  will be performed.  7.  Thoroughly rinse your body with warm water from the neck down.  8.  DO NOT shower/wash with your normal soap after using and rinsing off the CHG Soap.                9.  Pat yourself dry with a clean towel.            10.  Wear clean pajamas.            11.  Place clean sheets on your bed the night of your first shower and do not  sleep with pets. Day of Surgery : Do not apply any lotions/deodorants the morning of surgery.  Please wear clean clothes to the hospital/surgery  center.  FAILURE TO FOLLOW THESE INSTRUCTIONS MAY RESULT IN THE CANCELLATION OF YOUR SURGERY  PATIENT SIGNATURE_________________________________  NURSE SIGNATURE__________________________________  ________________________________________________________________________

## 2023-06-03 NOTE — Progress Notes (Signed)
COVID Vaccine Completed:  Date of COVID positive in last 90 days:  PCP - [facility]- Rolanda Lundborg, NP Cardiologist -   Chest x-ray -  EKG -  Stress Test -  ECHO -  Cardiac Cath -  Pacemaker/ICD device last checked: Spinal Cord Stimulator:  Bowel Prep -   Sleep Study -  CPAP -   Fasting Blood Sugar -  Checks Blood Sugar _____ times a day  Last dose of GLP1 agonist-  N/A GLP1 instructions:  Hold 7 days before surgery    Last dose of SGLT-2 inhibitors-  N/A SGLT-2 instructions:  Hold 3 days before surgery    Blood Thinner Instructions:  Time Aspirin Instructions: Last Dose:  Activity level:  Can go up a flight of stairs and perform activities of daily living without stopping and without symptoms of chest pain or shortness of breath.  Able to exercise without symptoms  Unable to go up a flight of stairs without symptoms of     Anesthesia review:   Patient denies shortness of breath, fever, cough and chest pain at PAT appointment  Patient verbalized understanding of instructions that were given to them at the PAT appointment. Patient was also instructed that they will need to review over the PAT instructions again at home before surgery.

## 2023-06-04 ENCOUNTER — Other Ambulatory Visit: Payer: Self-pay

## 2023-06-04 ENCOUNTER — Encounter (HOSPITAL_COMMUNITY): Payer: Self-pay

## 2023-06-04 ENCOUNTER — Encounter (HOSPITAL_COMMUNITY)
Admission: RE | Admit: 2023-06-04 | Discharge: 2023-06-04 | Disposition: A | Discharge status: Home / Self Care | Payer: BC Managed Care – PPO | Source: Ambulatory Visit | Attending: Urology | Admitting: Urology

## 2023-06-04 VITALS — BP 128/75 | HR 85 | Temp 97.9°F | Resp 16 | Ht 64.0 in | Wt 191.3 lb

## 2023-06-04 DIAGNOSIS — Z01818 Encounter for other preprocedural examination: Secondary | ICD-10-CM

## 2023-06-04 DIAGNOSIS — Z01812 Encounter for preprocedural laboratory examination: Secondary | ICD-10-CM | POA: Diagnosis present

## 2023-06-04 HISTORY — DX: Anxiety disorder, unspecified: F41.9

## 2023-06-04 LAB — CBC
HCT: 42.1 % (ref 36.0–46.0)
Hemoglobin: 14.4 g/dL (ref 12.0–15.0)
MCH: 34.2 pg — ABNORMAL HIGH (ref 26.0–34.0)
MCHC: 34.2 g/dL (ref 30.0–36.0)
MCV: 100 fL (ref 80.0–100.0)
Platelets: 220 10*3/uL (ref 150–400)
RBC: 4.21 MIL/uL (ref 3.87–5.11)
RDW: 12.9 % (ref 11.5–15.5)
WBC: 6.2 10*3/uL (ref 4.0–10.5)
nRBC: 0 % (ref 0.0–0.2)

## 2023-06-10 ENCOUNTER — Ambulatory Visit (HOSPITAL_COMMUNITY): Payer: BC Managed Care – PPO

## 2023-06-10 ENCOUNTER — Encounter (HOSPITAL_COMMUNITY): Payer: Self-pay | Admitting: Urology

## 2023-06-10 ENCOUNTER — Encounter (HOSPITAL_COMMUNITY)
Admission: RE | Disposition: A | Discharge status: Home / Self Care | Payer: Self-pay | Source: Home / Self Care | Attending: Urology

## 2023-06-10 ENCOUNTER — Ambulatory Visit (HOSPITAL_COMMUNITY)
Admission: RE | Admit: 2023-06-10 | Discharge: 2023-06-10 | Disposition: A | Discharge status: Home / Self Care | Payer: BC Managed Care – PPO | Attending: Urology | Admitting: Urology

## 2023-06-10 ENCOUNTER — Other Ambulatory Visit: Payer: Self-pay

## 2023-06-10 DIAGNOSIS — N3592 Unspecified urethral stricture, female: Secondary | ICD-10-CM | POA: Insufficient documentation

## 2023-06-10 DIAGNOSIS — M069 Rheumatoid arthritis, unspecified: Secondary | ICD-10-CM | POA: Insufficient documentation

## 2023-06-10 HISTORY — PX: CYSTOSCOPY WITH URETHRAL DILATATION: SHX5125

## 2023-06-10 LAB — POCT PREGNANCY, URINE: Preg Test, Ur: NEGATIVE

## 2023-06-10 SURGERY — CYSTOSCOPY, WITH URETHRAL DILATION
Anesthesia: General

## 2023-06-10 MED ORDER — CHLORHEXIDINE GLUCONATE 0.12 % MT SOLN
15.0000 mL | Freq: Once | OROMUCOSAL | Status: AC
  Administered 2023-06-10: 15 mL via OROMUCOSAL

## 2023-06-10 MED ORDER — CEFAZOLIN SODIUM-DEXTROSE 2-4 GM/100ML-% IV SOLN
2.0000 g | INTRAVENOUS | Status: AC
  Administered 2023-06-10: 2 g via INTRAVENOUS
  Filled 2023-06-10: qty 100

## 2023-06-10 MED ORDER — PROPOFOL 10 MG/ML IV BOLUS
INTRAVENOUS | Status: AC
  Filled 2023-06-10: qty 20

## 2023-06-10 MED ORDER — LIDOCAINE HCL (CARDIAC) PF 100 MG/5ML IV SOSY
PREFILLED_SYRINGE | INTRAVENOUS | Status: DC | PRN
  Administered 2023-06-10: 80 mg via INTRAVENOUS

## 2023-06-10 MED ORDER — PROPOFOL 10 MG/ML IV BOLUS
INTRAVENOUS | Status: DC | PRN
  Administered 2023-06-10: 200 mg via INTRAVENOUS

## 2023-06-10 MED ORDER — ORAL CARE MOUTH RINSE: 15.0000 mL | Freq: Once | OROMUCOSAL | Status: AC

## 2023-06-10 MED ORDER — LACTATED RINGERS IV SOLN
INTRAVENOUS | Status: DC
Start: 2023-06-10 — End: 2023-06-10

## 2023-06-10 MED ORDER — ACETAMINOPHEN 500 MG PO TABS
1000.0000 mg | ORAL_TABLET | Freq: Once | ORAL | Status: AC
  Administered 2023-06-10: 1000 mg via ORAL
  Filled 2023-06-10: qty 2

## 2023-06-10 MED ORDER — MIDAZOLAM HCL 5 MG/5ML IJ SOLN
INTRAMUSCULAR | Status: DC | PRN
  Administered 2023-06-10: 2 mg via INTRAVENOUS

## 2023-06-10 MED ORDER — ONDANSETRON HCL 4 MG/2ML IJ SOLN
INTRAMUSCULAR | Status: DC | PRN
  Administered 2023-06-10: 4 mg via INTRAVENOUS

## 2023-06-10 MED ORDER — LIDOCAINE HCL (PF) 2 % IJ SOLN
INTRAMUSCULAR | Status: AC
  Filled 2023-06-10: qty 5

## 2023-06-10 MED ORDER — OXYCODONE HCL 5 MG PO TABS: 5.0000 mg | ORAL_TABLET | ORAL | Status: DC | PRN

## 2023-06-10 MED ORDER — DEXAMETHASONE SODIUM PHOSPHATE 10 MG/ML IJ SOLN
INTRAMUSCULAR | Status: AC
Start: 2023-06-10 — End: ?
  Filled 2023-06-10: qty 1

## 2023-06-10 MED ORDER — FENTANYL CITRATE (PF) 100 MCG/2ML IJ SOLN
INTRAMUSCULAR | Status: DC | PRN
  Administered 2023-06-10: 100 ug via INTRAVENOUS

## 2023-06-10 MED ORDER — FENTANYL CITRATE (PF) 100 MCG/2ML IJ SOLN
INTRAMUSCULAR | Status: AC
  Filled 2023-06-10: qty 2

## 2023-06-10 MED ORDER — KETOROLAC TROMETHAMINE 30 MG/ML IJ SOLN
INTRAMUSCULAR | Status: DC | PRN
  Administered 2023-06-10: 15 mg via INTRAVENOUS

## 2023-06-10 MED ORDER — ONDANSETRON HCL 4 MG/2ML IJ SOLN
INTRAMUSCULAR | Status: AC
  Filled 2023-06-10: qty 2

## 2023-06-10 MED ORDER — HYDROMORPHONE HCL 1 MG/ML IJ SOLN
INTRAMUSCULAR | Status: AC
  Filled 2023-06-10: qty 1

## 2023-06-10 MED ORDER — HYDROMORPHONE HCL 1 MG/ML IJ SOLN
0.2500 mg | INTRAMUSCULAR | Status: DC | PRN
  Administered 2023-06-10: 0.5 mg via INTRAVENOUS

## 2023-06-10 MED ORDER — KETOROLAC TROMETHAMINE 30 MG/ML IJ SOLN
INTRAMUSCULAR | Status: AC
  Filled 2023-06-10: qty 1

## 2023-06-10 MED ORDER — DIPHENHYDRAMINE HCL 50 MG/ML IJ SOLN
25.0000 mg | Freq: Once | INTRAMUSCULAR | Status: AC
  Administered 2023-06-10: 25 mg via INTRAVENOUS
  Filled 2023-06-10: qty 1

## 2023-06-10 MED ORDER — DEXAMETHASONE SODIUM PHOSPHATE 10 MG/ML IJ SOLN
INTRAMUSCULAR | Status: DC | PRN
  Administered 2023-06-10: 10 mg via INTRAVENOUS

## 2023-06-10 MED ORDER — STERILE WATER FOR IRRIGATION IR SOLN
Status: DC | PRN
  Administered 2023-06-10: 3000 mL

## 2023-06-10 MED ORDER — MIDAZOLAM HCL 2 MG/2ML IJ SOLN
INTRAMUSCULAR | Status: AC
  Filled 2023-06-10: qty 2

## 2023-06-10 MED ORDER — SODIUM CHLORIDE 0.9% FLUSH: 3.0000 mL | Freq: Two times a day (BID) | INTRAVENOUS | Status: DC

## 2023-06-10 SURGICAL SUPPLY — 14 items
BAG URO CATCHER STRL LF (MISCELLANEOUS) ×1 IMPLANT
BALLN NEPHROSTOMY (BALLOONS)
BALLOON NEPHROSTOMY (BALLOONS) IMPLANT
CATH ROBINSON RED A/P 16FR (CATHETERS) IMPLANT
CATH URETL OPEN 5X70 (CATHETERS) IMPLANT
CLOTH BEACON ORANGE TIMEOUT ST (SAFETY) ×1 IMPLANT
GLOVE SURG SS PI 8.0 STRL IVOR (GLOVE) IMPLANT
GOWN STRL REUS W/ TWL XL LVL3 (GOWN DISPOSABLE) ×1 IMPLANT
GUIDEWIRE STR DUAL SENSOR (WIRE) ×1 IMPLANT
KIT TURNOVER KIT A (KITS) IMPLANT
MANIFOLD NEPTUNE II (INSTRUMENTS) ×1 IMPLANT
PACK CYSTO (CUSTOM PROCEDURE TRAY) ×1 IMPLANT
PENCIL SMOKE EVACUATOR (MISCELLANEOUS) IMPLANT
TUBING CONNECTING 10 (TUBING) ×1 IMPLANT

## 2023-06-10 NOTE — Transfer of Care (Signed)
Immediate Anesthesia Transfer of Care Note  Patient: Nicole Burgess  Procedure(s) Performed: CYSTOSCOPY WITH URETHRAL DILATATION  Patient Location: PACU  Anesthesia Type:General  Level of Consciousness: awake, alert , oriented, and patient cooperative  Airway & Oxygen Therapy: Patient Spontanous Breathing and Patient connected to nasal cannula oxygen  Post-op Assessment: Report given to RN and Post -op Vital signs reviewed and stable  Post vital signs: Reviewed and stable  Last Vitals:  Vitals Value Taken Time  BP 115/73 06/10/23 1001  Temp    Pulse    Resp 13 06/10/23 1003  SpO2    Vitals shown include unfiled device data.  Last Pain:  Vitals:   06/10/23 0723  TempSrc: Oral  PainSc: 0-No pain      Patients Stated Pain Goal: 5 (06/10/23 0723)  Complications: No notable events documented.

## 2023-06-10 NOTE — Anesthesia Procedure Notes (Signed)
Procedure Name: LMA Insertion Date/Time: 06/10/2023 9:35 AM  Performed by: Lezlie Lye, CRNAPre-anesthesia Checklist: Patient identified, Emergency Drugs available, Suction available and Patient being monitored Patient Re-evaluated:Patient Re-evaluated prior to induction Oxygen Delivery Method: Circle System Utilized Preoxygenation: Pre-oxygenation with 100% oxygen Induction Type: IV induction LMA: LMA inserted LMA Size: 4.0 Number of attempts: 1 Placement Confirmation: positive ETCO2 Tube secured with: Tape Dental Injury: Teeth and Oropharynx as per pre-operative assessment

## 2023-06-10 NOTE — Op Note (Signed)
Procedure: Cystoscopy with urethral dilation.  Preop diagnosis: Recurrent urethral stricture.  Postop diagnosis: Same.  Surgeon: Dr. Bjorn Pippin.  Anesthesia: General.  Specimen: None.  Drains: None.  EBL: None.  Complications: None.  Indications: The patient is a 37 year old female with a history of recurrent urethral stricture disease who previously undergone dilation on October 4 but has had recurrent stenosis requiring placement of pediatric catheter which was removed a few days ago.  Procedure: She taken the operating room where she was given antibiotics.  A general anesthetic was induced.  She was placed in lithotomy position and fitted with PAS hose.Marland Kitchen  She was prepped with Betadine solution and draped in usual sterile fashion.  Cystoscopy diascopy was attempted with a 21 [place] scope and 30 degree lens but the stenosis was too tight to pass the scope.  I then dilated her from 41 to 54 [place] with female sounds.  The stricture easily dilated with minimal bleeding.  Once dilation was complete inspection of bladder was performed with the cystoscope.  The area of the distal stricture was visualized and it did not appear very dense.  The remainder the urethra was normal.  The bladder wall had mild trabeculation with some mucosal erythema on the posterior wall from the tips of the dilators but no perforation.  No tumors or stones were identified ureteral orifices were unremarkable.  The bladder was partially drained and the scope was removed.  She was taken down from the lithotomy position, her anesthetic was reversed and she was moved recovery in stable condition.  There were no complications.

## 2023-06-10 NOTE — Interval H&P Note (Signed)
History and Physical Interval Note:  06/10/2023 9:16 AM  Nicole Burgess  has presented today for surgery, with the diagnosis of URETHRAL STRICTURE.  The various methods of treatment have been discussed with the patient and family. After consideration of risks, benefits and other options for treatment, the patient has consented to  Procedure(s) with comments: CYSTOSCOPY WITH URETHRAL DILATATION (N/A) - 30 MINUTE CASE as a surgical intervention.  The patient's history has been reviewed, patient examined, no change in status, stable for surgery.  I have reviewed the patient's chart and labs.  Questions were answered to the patient's satisfaction.     Bjorn Pippin

## 2023-06-10 NOTE — Anesthesia Preprocedure Evaluation (Addendum)
Anesthesia Evaluation  Patient identified by MRN, date of birth, ID band Patient awake    Reviewed: Allergy & Precautions, H&P , NPO status , Patient's Chart, lab work & pertinent test results  Airway Mallampati: II  TM Distance: >3 FB Neck ROM: Full    Dental no notable dental hx. (+) Teeth Intact, Dental Advisory Given   Pulmonary neg pulmonary ROS   Pulmonary exam normal breath sounds clear to auscultation       Cardiovascular negative cardio ROS  Rhythm:Regular Rate:Normal     Neuro/Psych   Anxiety     negative neurological ROS     GI/Hepatic negative GI ROS, Neg liver ROS,,,  Endo/Other  negative endocrine ROS    Renal/GU negative Renal ROS  negative genitourinary   Musculoskeletal  (+) Arthritis , Rheumatoid disorders,    Abdominal   Peds  Hematology negative hematology ROS (+)   Anesthesia Other Findings   Reproductive/Obstetrics negative OB ROS                             Anesthesia Physical Anesthesia Plan  ASA: 2  Anesthesia Plan: General   Post-op Pain Management: Tylenol PO (pre-op)* and Toradol IV (intra-op)*   Induction: Intravenous  PONV Risk Score and Plan: 4 or greater and Ondansetron, Dexamethasone and Midazolam  Airway Management Planned: LMA  Additional Equipment:   Intra-op Plan:   Post-operative Plan: Extubation in OR  Informed Consent: I have reviewed the patients History and Physical, chart, labs and discussed the procedure including the risks, benefits and alternatives for the proposed anesthesia with the patient or authorized representative who has indicated his/her understanding and acceptance.     Dental advisory given  Plan Discussed with: CRNA  Anesthesia Plan Comments:        Anesthesia Quick Evaluation

## 2023-06-10 NOTE — Discharge Instructions (Addendum)
CYSTOSCOPY HOME CARE INSTRUCTIONS  Activity: Rest for the remainder of the day.  Do not drive or operate equipment today.  You may resume normal activities in one to two days as instructed by your physician.   Meals: Drink plenty of liquids and eat light foods such as gelatin or soup this evening.  You may return to a normal meal plan tomorrow.  Return to Work: You may return to work in one to two days or as instructed by your physician.  Special Instructions / Symptoms: Call your physician if any of these symptoms occur:   -persistent or heavy bleeding  -bleeding which continues after first few urination  -large blood clots that are difficult to pass  -urine stream diminishes or stops completely  -fever equal to or higher than 101 degrees Farenheit.  -cloudy urine with a strong, foul odor  -severe pain  Females should always wipe from front to back after elimination.  You may feel some burning pain when you urinate.  This should disappear with time.  Applying moist heat to the lower abdomen or a hot tub bath may help relieve the pain. \  To best avoid recurrent stenosis, it would be worthwhile to begin daily self catheterization.  If you would like to do this, please let the office know so we can get you the catheters.   Patient Signature:  ________________________________________________________  Nurse's Signature:  ________________________________________________________

## 2023-06-10 NOTE — Anesthesia Postprocedure Evaluation (Signed)
Anesthesia Post Note  Patient: Nicole Burgess  Procedure(s) Performed: CYSTOSCOPY WITH URETHRAL DILATATION     Patient location during evaluation: PACU Anesthesia Type: General Level of consciousness: awake and alert Pain management: pain level controlled Vital Signs Assessment: post-procedure vital signs reviewed and stable Respiratory status: spontaneous breathing, nonlabored ventilation and respiratory function stable Cardiovascular status: blood pressure returned to baseline and stable Postop Assessment: no apparent nausea or vomiting Anesthetic complications: no  No notable events documented.  Last Vitals:  Vitals:   06/10/23 1045 06/10/23 1056  BP: 100/66 123/74  Pulse: 70 60  Resp: 14   Temp: 36.4 C (!) 36.4 C  SpO2: 98% 100%    Last Pain:  Vitals:   06/10/23 1056  TempSrc: Oral  PainSc: 2                  Lelend Heinecke,W. EDMOND

## 2023-06-10 NOTE — H&P (Signed)
Urethral stricture   Nicole Burgess is a 37 year old female followed by Dr. Annabell Howells for a urethral stricture. She was last seen in September and had undergone a balloon dilation with Optilume last spring. Her symptoms were progressive in September and there was a tentative plan to proceed with repeat dilation. This has not yet been performed. This is partly related to insurance reasons apparently. Her symptoms unfortunately have progressed over the past 1 to 2 weeks. She is now finding it more difficult to urinate although is doing slightly better today than yesterday. She denies any fever.   06/04/23: Km returns today in f/u. She is no macrobid for a resistant Klebsiella at her last visit. She was seen in the ER and was in retention with a PVR of >358ml. A pediatric catheter was successfully placed.     ALLERGIES: No Allergies    MEDICATIONS: Macrobid 100 mg capsule  Depakote 500 mg tablet, delayed release  Pristiq 100 mg tablet, extended release 24 hr  Vraylar     GU PSH: Cystoscopy Hydrodistention - 09/13/2022       PSH Notes: Cesarean Section,  tubal ligation  Cystoscopy with HOD  Urethral dilation x 2.    NON-GU PSH: Cesarean Delivery Only - 2010 Visit Complexity (formerly [facility]1X) - 04/01/2023     GU PMH: Acute Cystitis/UTI - 05/28/2023 Anterior urethral stricture - 05/28/2023, Her urinary symptoms have worsened and she feels she needs another dilation with optilume since that lasted longer than prior dilations and I will do an HOD as well.. She may also have a UTI so I will culture and get her started on antibiotics. I will get her scheduled for the procedure. She is aware of the risks. If her symptoms resolve with the antibiotics, she will let me know and we can cancel the surgery. , - 04/01/2023, - 09/27/2022, She has a history of a urethral stricture with chronic voiding pain and has a reduced stream and an elevated PVR today. I discussed options and with the IC and bladder pain, I  will get her set up to go to the OR for cystoscopy with urethral dilation with optilume balloon application, HOD and instillation of P& M. I have reviewed the risks of bleeding, infection, urethral and bladder injury, incotinence, recurrent stricture, need for secondary procedure, thrombotic events and anesthetic complications as well as precautions with conception related to the drug coated balloon. , - 08/09/2022 Dysuria, culture today. Macrobid started. - 04/01/2023, - 09/27/2022 (Stable), - 08/09/2022, Dysuria, - 2014 Interstitial Cystitis (w/o hematuria) - 04/01/2023, (Stable), - 08/09/2022, Chronic interstitial cystitis without hematuria, - 2014 Weak Urinary Stream - 04/01/2023, - 08/09/2022 Incomplete bladder emptying - 09/27/2022, - 08/09/2022 Personal Hx Oth Urinary System diseases, History of chronic cystitis - 2014      PMH Notes:  1898-07-08 00:00:00 - Note: Normal Routine History And Physical Adult   NON-GU PMH: Anxiety Arthritis    FAMILY HISTORY: Family Health Status - Father alive at age 13 - Runs In Family Family Health Status - Mother's Age - Runs In [facility] Family Health Status Number - Father Hyperthyroidism - Mother melanoma - Grandmother nephrolithiasis - Father   SOCIAL HISTORY: Marital Status: Married Preferred Language: English; Ethnicity: Not Hispanic Or Latino; Race: White Current Smoking Status: Patient has never smoked.   Tobacco Use Assessment Completed: Used Tobacco in last 30 days? Does not use smokeless tobacco. Has never drank.  Does not use drugs. Drinks 2 caffeinated drinks per day. 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     Notes: Caffeine Use, Marital History - Currently Married, Alcohol Use, Tobacco Use, Occupation:   REVIEW OF SYSTEMS:    GU Review Female:   Patient denies frequent urination, hard to postpone urination, burning /pain with urination, get up at night to urinate, leakage of urine, stream starts and  stops, trouble starting your stream, have to strain to urinate, and being pregnant.  Gastrointestinal (Upper):   Patient denies nausea, vomiting, and indigestion/ heartburn.  Gastrointestinal (Lower):   Patient denies diarrhea and constipation.  Constitutional:   Patient denies fever, night sweats, weight loss, and fatigue.  Skin:   Patient denies skin rash/ lesion and itching.  Eyes:   Patient denies blurred vision and double vision.  Ears/ Nose/ Throat:   Patient denies sinus problems and sore throat.  Hematologic/Lymphatic:   Patient denies swollen glands and easy bruising.  Cardiovascular:   Patient denies leg swelling and chest pains.  Respiratory:   Patient denies cough and shortness of breath.  Endocrine:   Patient denies excessive thirst.  Musculoskeletal:   Patient denies back pain and joint pain.  Neurological:   Patient denies headaches and dizziness.  Psychologic:   Patient denies depression and anxiety.   VITAL SIGNS:      06/04/2023 09:43 AM  Weight 195 lb / 88.45 kg  Height 65 in / 165.1 cm  BP 113/72 mmHg  Heart Rate 86 /min  Temperature 97.3 F / 36.2 C  BMI 32.4 kg/m   Complexity of Data:  Records Review:   Previous Hospital Records, Previous Patient Records  Urine Test Review:   Urine Culture  Notes:                     ER records reviewed.    PROCEDURES:          Visit Complexity - G2211 Chronic management   ASSESSMENT:      ICD-10 Details  1 GU:   Acute Cystitis/UTI - N30.00 Acute, Uncomplicated, Improving - She is on macrobid for the Klebsiella.   2   Anterior urethral stricture - N35.013 Chronic, Exacerbation - We will get the foley out today and she is scheduled for dilation next week.   3   Incomplete bladder emptying - R39.14 Chronic, Exacerbation     PLAN:            Medications Stop Meds: Cephalexin 500 mg capsule 1 capsule PO TID  Start: 05/28/2023  Stop: 06/04/2023   Discontinue: 06/04/2023  - Reason: The medication cycle was completed.             Schedule Return Visit/Planned Activity: Keep Scheduled Appointment          Document Letter(s):  Created for Patient: Clinical Summary         Notes:   CC: Rolanda Lundborg with [place], [place].         Next Appointment:      Next Appointment: 06/10/2023 09:00 AM    Appointment Type: Surgery     Location: [facility] [phone]    Provider: Bjorn Pippin, M.D.    Reason for Visit: OP-WL--CYSTO DIL - 100% Covered

## 2023-06-11 ENCOUNTER — Encounter (HOSPITAL_COMMUNITY): Payer: Self-pay | Admitting: Urology
